# Patient Record
Sex: Female | Born: 1940 | Race: Black or African American | Hispanic: No | Marital: Single | Smoking: Never smoker
Health system: Southern US, Community
[De-identification: ages and names within clinical notes are randomized; demographics above are authoritative.]

## PROBLEM LIST (undated history)

## (undated) DIAGNOSIS — E7800 Pure hypercholesterolemia, unspecified: Secondary | ICD-10-CM

## (undated) DIAGNOSIS — F32A Depression, unspecified: Secondary | ICD-10-CM

## (undated) DIAGNOSIS — I10 Essential (primary) hypertension: Secondary | ICD-10-CM

## (undated) HISTORY — PX: ABDOMINAL HYSTERECTOMY: SHX81

---

## 2020-12-28 DIAGNOSIS — E782 Mixed hyperlipidemia: Secondary | ICD-10-CM | POA: Diagnosis present

## 2020-12-28 DIAGNOSIS — I10 Essential (primary) hypertension: Secondary | ICD-10-CM | POA: Diagnosis present

## 2020-12-28 DIAGNOSIS — K219 Gastro-esophageal reflux disease without esophagitis: Secondary | ICD-10-CM | POA: Diagnosis present

## 2021-01-25 DIAGNOSIS — G3184 Mild cognitive impairment, so stated: Secondary | ICD-10-CM | POA: Diagnosis present

## 2021-04-02 ENCOUNTER — Emergency Department (HOSPITAL_BASED_OUTPATIENT_CLINIC_OR_DEPARTMENT_OTHER): Payer: Medicare HMO

## 2021-04-02 ENCOUNTER — Inpatient Hospital Stay (HOSPITAL_BASED_OUTPATIENT_CLINIC_OR_DEPARTMENT_OTHER)
Admission: EM | Admit: 2021-04-02 | Discharge: 2021-04-04 | DRG: 640 | Disposition: A | Discharge status: Home / Self Care | Payer: Medicare HMO | Attending: Internal Medicine | Admitting: Internal Medicine

## 2021-04-02 ENCOUNTER — Other Ambulatory Visit: Payer: Self-pay

## 2021-04-02 ENCOUNTER — Encounter (HOSPITAL_BASED_OUTPATIENT_CLINIC_OR_DEPARTMENT_OTHER): Payer: Self-pay | Admitting: *Deleted

## 2021-04-02 DIAGNOSIS — E876 Hypokalemia: Secondary | ICD-10-CM | POA: Diagnosis present

## 2021-04-02 DIAGNOSIS — Z7982 Long term (current) use of aspirin: Secondary | ICD-10-CM | POA: Diagnosis not present

## 2021-04-02 DIAGNOSIS — F0390 Unspecified dementia without behavioral disturbance: Secondary | ICD-10-CM | POA: Diagnosis present

## 2021-04-02 DIAGNOSIS — Z20822 Contact with and (suspected) exposure to covid-19: Secondary | ICD-10-CM | POA: Diagnosis present

## 2021-04-02 DIAGNOSIS — R634 Abnormal weight loss: Secondary | ICD-10-CM

## 2021-04-02 DIAGNOSIS — R627 Adult failure to thrive: Secondary | ICD-10-CM | POA: Diagnosis present

## 2021-04-02 DIAGNOSIS — F32A Depression, unspecified: Secondary | ICD-10-CM | POA: Diagnosis present

## 2021-04-02 DIAGNOSIS — K5900 Constipation, unspecified: Secondary | ICD-10-CM | POA: Diagnosis present

## 2021-04-02 DIAGNOSIS — R001 Bradycardia, unspecified: Secondary | ICD-10-CM | POA: Diagnosis present

## 2021-04-02 DIAGNOSIS — K219 Gastro-esophageal reflux disease without esophagitis: Secondary | ICD-10-CM | POA: Diagnosis present

## 2021-04-02 DIAGNOSIS — N179 Acute kidney failure, unspecified: Secondary | ICD-10-CM

## 2021-04-02 DIAGNOSIS — G3184 Mild cognitive impairment, so stated: Secondary | ICD-10-CM | POA: Diagnosis not present

## 2021-04-02 DIAGNOSIS — I10 Essential (primary) hypertension: Secondary | ICD-10-CM | POA: Diagnosis present

## 2021-04-02 DIAGNOSIS — E785 Hyperlipidemia, unspecified: Secondary | ICD-10-CM | POA: Diagnosis present

## 2021-04-02 DIAGNOSIS — Z6826 Body mass index (BMI) 26.0-26.9, adult: Secondary | ICD-10-CM | POA: Diagnosis not present

## 2021-04-02 DIAGNOSIS — Z79899 Other long term (current) drug therapy: Secondary | ICD-10-CM | POA: Diagnosis not present

## 2021-04-02 DIAGNOSIS — E782 Mixed hyperlipidemia: Secondary | ICD-10-CM | POA: Diagnosis present

## 2021-04-02 DIAGNOSIS — N1832 Chronic kidney disease, stage 3b: Secondary | ICD-10-CM

## 2021-04-02 DIAGNOSIS — E871 Hypo-osmolality and hyponatremia: Secondary | ICD-10-CM | POA: Diagnosis present

## 2021-04-02 DIAGNOSIS — R9431 Abnormal electrocardiogram [ECG] [EKG]: Secondary | ICD-10-CM | POA: Diagnosis present

## 2021-04-02 DIAGNOSIS — E43 Unspecified severe protein-calorie malnutrition: Secondary | ICD-10-CM | POA: Diagnosis present

## 2021-04-02 DIAGNOSIS — R630 Anorexia: Secondary | ICD-10-CM | POA: Diagnosis not present

## 2021-04-02 HISTORY — DX: Depression, unspecified: F32.A

## 2021-04-02 HISTORY — DX: Pure hypercholesterolemia, unspecified: E78.00

## 2021-04-02 HISTORY — DX: Essential (primary) hypertension: I10

## 2021-04-02 LAB — CBC WITH DIFFERENTIAL/PLATELET
Abs Immature Granulocytes: 0.02 10*3/uL (ref 0.00–0.07)
Basophils Absolute: 0.1 10*3/uL (ref 0.0–0.1)
Basophils Relative: 1 %
Eosinophils Absolute: 0 10*3/uL (ref 0.0–0.5)
Eosinophils Relative: 0 %
HCT: 36.8 % (ref 36.0–46.0)
Hemoglobin: 12.5 g/dL (ref 12.0–15.0)
Immature Granulocytes: 0 %
Lymphocytes Relative: 31 %
Lymphs Abs: 1.7 10*3/uL (ref 0.7–4.0)
MCH: 27.5 pg (ref 26.0–34.0)
MCHC: 34 g/dL (ref 30.0–36.0)
MCV: 80.9 fL (ref 80.0–100.0)
Monocytes Absolute: 0.4 10*3/uL (ref 0.1–1.0)
Monocytes Relative: 8 %
Neutro Abs: 3.3 10*3/uL (ref 1.7–7.7)
Neutrophils Relative %: 60 %
Platelets: 249 10*3/uL (ref 150–400)
RBC: 4.55 MIL/uL (ref 3.87–5.11)
RDW: 13.5 % (ref 11.5–15.5)
WBC: 5.6 10*3/uL (ref 4.0–10.5)
nRBC: 0 % (ref 0.0–0.2)

## 2021-04-02 LAB — COMPREHENSIVE METABOLIC PANEL
ALT: 19 U/L (ref 0–44)
AST: 40 U/L (ref 15–41)
Albumin: 4.2 g/dL (ref 3.5–5.0)
Alkaline Phosphatase: 17 U/L — ABNORMAL LOW (ref 38–126)
Anion gap: 15 (ref 5–15)
BUN: 32 mg/dL — ABNORMAL HIGH (ref 8–23)
CO2: 30 mmol/L (ref 22–32)
Calcium: 9.8 mg/dL (ref 8.9–10.3)
Chloride: 86 mmol/L — ABNORMAL LOW (ref 98–111)
Creatinine, Ser: 1.93 mg/dL — ABNORMAL HIGH (ref 0.44–1.00)
GFR, Estimated: 26 mL/min — ABNORMAL LOW (ref >60)
Glucose, Bld: 94 mg/dL (ref 70–99)
Potassium: <2 mmol/L — CL (ref 3.5–5.1)
Sodium: 131 mmol/L — ABNORMAL LOW (ref 135–145)
Total Bilirubin: 1.1 mg/dL (ref 0.3–1.2)
Total Protein: 8.2 g/dL — ABNORMAL HIGH (ref 6.5–8.1)

## 2021-04-02 LAB — RESP PANEL BY RT-PCR (FLU A&B, COVID) ARPGX2
Influenza A by PCR: NEGATIVE
Influenza B by PCR: NEGATIVE
SARS Coronavirus 2 by RT PCR: NEGATIVE

## 2021-04-02 LAB — BASIC METABOLIC PANEL
Anion gap: 15 (ref 5–15)
Anion gap: 12 (ref 5–15)
BUN: 32 mg/dL — ABNORMAL HIGH (ref 8–23)
BUN: 30 mg/dL — ABNORMAL HIGH (ref 8–23)
CO2: 28 mmol/L (ref 22–32)
CO2: 29 mmol/L (ref 22–32)
Calcium: 9.6 mg/dL (ref 8.9–10.3)
Calcium: 9.4 mg/dL (ref 8.9–10.3)
Chloride: 88 mmol/L — ABNORMAL LOW (ref 98–111)
Chloride: 96 mmol/L — ABNORMAL LOW (ref 98–111)
Creatinine, Ser: 1.86 mg/dL — ABNORMAL HIGH (ref 0.44–1.00)
Creatinine, Ser: 1.57 mg/dL — ABNORMAL HIGH (ref 0.44–1.00)
GFR, Estimated: 27 mL/min — ABNORMAL LOW (ref >60)
GFR, Estimated: 34 mL/min — ABNORMAL LOW (ref >60)
Glucose, Bld: 101 mg/dL — ABNORMAL HIGH (ref 70–99)
Glucose, Bld: 92 mg/dL (ref 70–99)
Potassium: 2 mmol/L — CL (ref 3.5–5.1)
Potassium: 2.9 mmol/L — ABNORMAL LOW (ref 3.5–5.1)
Sodium: 131 mmol/L — ABNORMAL LOW (ref 135–145)
Sodium: 137 mmol/L (ref 135–145)

## 2021-04-02 LAB — PHOSPHORUS: Phosphorus: 2.7 mg/dL (ref 2.5–4.6)

## 2021-04-02 LAB — MAGNESIUM
Magnesium: 2.1 mg/dL (ref 1.7–2.4)
Magnesium: 2 mg/dL (ref 1.7–2.4)

## 2021-04-02 LAB — LIPASE, BLOOD: Lipase: 64 U/L — ABNORMAL HIGH (ref 11–51)

## 2021-04-02 MED ORDER — ROSUVASTATIN CALCIUM 20 MG PO TABS: 40.0000 mg | ORAL_TABLET | Freq: Every day | ORAL | Status: DC

## 2021-04-02 MED ORDER — ESCITALOPRAM OXALATE 20 MG PO TABS: 20.0000 mg | ORAL_TABLET | Freq: Every day | ORAL | Status: DC

## 2021-04-02 MED ORDER — POTASSIUM CHLORIDE 10 MEQ/100ML IV SOLN
10.0000 meq | INTRAVENOUS | Status: AC
  Administered 2021-04-02 (×3): 10 meq via INTRAVENOUS
  Filled 2021-04-02 (×3): qty 100

## 2021-04-02 MED ORDER — ASPIRIN EC 81 MG PO TBEC
81.0000 mg | DELAYED_RELEASE_TABLET | Freq: Every day | ORAL | Status: DC
  Administered 2021-04-02 – 2021-04-04 (×3): 81 mg via ORAL
  Filled 2021-04-02 (×3): qty 1

## 2021-04-02 MED ORDER — HYDRALAZINE HCL 20 MG/ML IJ SOLN: 10.0000 mg | Freq: Four times a day (QID) | INTRAMUSCULAR | Status: DC | PRN

## 2021-04-02 MED ORDER — SODIUM CHLORIDE 0.9 % IV SOLN: INTRAVENOUS | Status: DC | PRN

## 2021-04-02 MED ORDER — POTASSIUM CHLORIDE CRYS ER 20 MEQ PO TBCR
40.0000 meq | EXTENDED_RELEASE_TABLET | Freq: Once | ORAL | Status: AC
  Administered 2021-04-02: 40 meq via ORAL
  Filled 2021-04-02: qty 2

## 2021-04-02 MED ORDER — LACTATED RINGERS IV BOLUS
1000.0000 mL | Freq: Once | INTRAVENOUS | Status: AC
  Administered 2021-04-02: 1000 mL via INTRAVENOUS

## 2021-04-02 MED ORDER — HEPARIN SODIUM (PORCINE) 5000 UNIT/ML IJ SOLN
5000.0000 [IU] | Freq: Three times a day (TID) | INTRAMUSCULAR | Status: DC
  Administered 2021-04-02 – 2021-04-03 (×3): 5000 [IU] via SUBCUTANEOUS
  Filled 2021-04-02 (×3): qty 1

## 2021-04-02 MED ORDER — POTASSIUM CHLORIDE 10 MEQ/100ML IV SOLN
10.0000 meq | INTRAVENOUS | Status: DC
  Administered 2021-04-02 (×2): 10 meq via INTRAVENOUS
  Filled 2021-04-02 (×2): qty 100

## 2021-04-02 MED ORDER — ROSUVASTATIN CALCIUM 10 MG PO TABS: 10.0000 mg | ORAL_TABLET | Freq: Every day | ORAL | Status: DC

## 2021-04-02 NOTE — Care Plan (Addendum)
Transfer from [facility] Dr. Clarene Duke requesting  79yow presented w/ anorexia, FTT, malaise. Found to have profound hypokalemia w/ classic EKG changes  PMH includes  HTN  Hyperlipidemia   Exam: Per EDP AFVSS, benigh  Data  K+ 2.0, Na+ 131, Cr 1.86  Lipase 64  A/P Profound hypokalemia FTT  OBS to progressive bed Replete potassium F/u CT abd/pelvis pending  EDP remains attending at [facility] and responsible for care until arrives at [facility]. TRH available for questions.  Brendia Sacks, MD [facility]  No charge note

## 2021-04-02 NOTE — Plan of Care (Signed)
Initiated care plan 

## 2021-04-02 NOTE — H&P (Signed)
History and Physical    Alexandra Griffith WUJ:[id number] DOB: 15-Apr-1941 DOA: 04/02/2021  PCP: Spero GeraldsSundaragiri, Pranathi Rao, MD    [facility]   Chief Complaint:  Hypokalemia   HPI: Alexandra PughHattie Griffith is a 80 y.o. female seen in ed with complaints of decreased appetite and constipation found to be hypokalemic at [facility].  Patient has been constipated for nearly 2 weeks.  No reports of abdominal pain urinary symptoms vomiting diarrhea.  Also no muscle aches chest pain shortness of breath.  Patient was seen by Dr. Clarene DukeLittle at [facility] and initial blood work showed severe hypokalemia of a potassium level that was less than 2 and repeat labs showed a potassium level of 2.  Patient's creatinine was 1.86, magnesium of 2.1.    [facility]. Upon my interview patient reports that she has been feeling weak and tired for the past 2 weeks.  Appetite has gone down, reports that she has no desire to eat.  She went to her doctor for constipation.  She was given lactulose for constipation but she still not eating.  Patient denies any other complaints or feeling bad.  Pt has past medical history of hypertension, dyslipidemia, vitamin D deficiency, depression.  ED Course:  Vitals:   04/02/21 1441 04/02/21 1600 04/02/21 1700 04/02/21 1853  BP: 126/81 138/65 127/69 137/65  Pulse: (!) 51 (!) 50 (!) 50 (!) 49  Resp: 17 13 19 14   Temp:    98.4 F (36.9 C)  TempSrc:    Oral  SpO2: 100% 96% 100% 100%  Weight:      Height:      Patient was given potassium chloride IV 30 mEq, potassium chloride 40 mEq p.o. x1.  Her labs showed normal magnesium level.  EKG did show bradycardia with a heart rate of 53, normal PR interval, T wave inversion versus U waves in lead to 3 aVF and V2 V3 V4 V5 and V6, with a significant Q-wave in V1, and a prolonged QT interval of 570. Repeat stat BMP shows a potassium level of 2.9, mag and Phos level pending.  Review  of Systems:  Review of Systems  Constitutional: Positive for malaise/fatigue.  HENT: Negative.   Eyes: Negative.   Respiratory: Negative.   Cardiovascular: Negative.   Gastrointestinal: Negative.   Genitourinary: Negative.   Musculoskeletal: Negative.   Neurological: Negative.       Past Medical History:  Diagnosis Date  . Depression   . High cholesterol   . Hypertension     Past Surgical History:  Procedure Laterality Date  . ABDOMINAL HYSTERECTOMY       reports that she has never smoked. She has never used smokeless tobacco. She reports that she does not drink alcohol and does not use drugs.  No Known Allergies  Family History  Problem Relation Age of Onset  . High blood pressure Mother        8 total children 3 brothers deceased HTN, [place] and complications of agent orange, 1 sister deceased due to complications of dementia 2 sisters living    Prior to Admission medications   Medication Sig Start Date End Date Taking? Authorizing Provider  ergocalciferol (VITAMIN D2) 1.25 MG (50000 UT) capsule Take by mouth. 02/22/21 02/22/22 Yes [provider]  escitalopram (LEXAPRO) 20 MG tablet Take 1 tablet by mouth daily. 03/28/21 06/26/21 Yes [provider]  lactulose (CHRONULAC) 10 GM/15ML solution Take by  mouth. 03/28/21  Yes [provider]  rosuvastatin (CRESTOR) 20 MG tablet Take 1 tablet by mouth daily. 01/25/21  Yes [provider]  aspirin 81 MG EC tablet Take by mouth.    [provider]    Physical Exam: Vitals:   04/02/21 1441 04/02/21 1600 04/02/21 1700 04/02/21 1853  BP: 126/81 138/65 127/69 137/65  Pulse: (!) 51 (!) 50 (!) 50 (!) 49  Resp: 17 13 19 14   Temp:    98.4 F (36.9 C)  TempSrc:    Oral  SpO2: 100% 96% 100% 100%  Weight:      Height:       Physical Exam Constitutional:      General: She is not in acute distress.    Appearance: Normal appearance. She is not ill-appearing, toxic-appearing or  diaphoretic.  HENT:     Head: Normocephalic and atraumatic.     Right Ear: External ear normal.     Left Ear: External ear normal.     Nose: Nose normal.     Mouth/Throat:     Mouth: Mucous membranes are dry.  Eyes:     Extraocular Movements: Extraocular movements intact.     Pupils: Pupils are equal, round, and reactive to light.  Neck:     Vascular: No carotid bruit.  Cardiovascular:     Rate and Rhythm: Normal rate and regular rhythm.     Pulses: Normal pulses.     Heart sounds: Normal heart sounds.  Pulmonary:     Effort: Pulmonary effort is normal.     Breath sounds: Normal breath sounds.  Abdominal:     General: Bowel sounds are normal. There is no distension.     Palpations: Abdomen is soft. There is no mass.     Tenderness: There is no abdominal tenderness. There is no guarding.     Hernia: No hernia is present.  Musculoskeletal:     Right lower leg: No edema.     Left lower leg: No edema.  Skin:    General: Skin is warm.  Neurological:     General: No focal deficit present.     Mental Status: She is alert and oriented to person, place, and time.     Cranial Nerves: No cranial nerve deficit.     Motor: Motor function is intact.  Psychiatric:        Attention and Perception: Attention normal.        Mood and Affect: Mood normal.        Speech: Speech normal.        Behavior: Behavior normal. Behavior is cooperative.    Labs on Admission: I have personally reviewed following labs and imaging studies  No results for input(s): CKTOTAL, CKMB, TROPONINI in the last 72 hours. Lab Results  Component Value Date   WBC 5.6 04/02/2021   HGB 12.5 04/02/2021   HCT 36.8 04/02/2021   MCV 80.9 04/02/2021   PLT 249 04/02/2021    Recent Labs  Lab 04/02/21 1248 04/02/21 1353 04/02/21 1943  NA 131*   < > 137  K <2.0*   < > 2.9*  CL 86*   < > 96*  CO2 30   < > 29  BUN 32*   < > 30*  CREATININE 1.93*   < > 1.57*  CALCIUM 9.8   < > 9.4  PROT 8.2*  --   --   BILITOT  1.1  --   --   ALKPHOS 17*  --   --  ALT 19  --   --   AST 40  --   --   GLUCOSE 94   < > 92   < > = values in this interval not displayed.   No results found for: CHOL, HDL, LDLCALC, TRIG No results found for: DDIMER Invalid input(s): POCBNP  Urinalysis No results found for: COLORURINE, APPEARANCEUR, LABSPEC, PHURINE, GLUCOSEU, HGBUR, BILIRUBINUR, KETONESUR, PROTEINUR, UROBILINOGEN, NITRITE, LEUKOCYTESUR      COVID-19 Labs  No results for input(s): DDIMER, FERRITIN, LDH, CRP in the last 72 hours.  Lab Results  Component Value Date   SARSCOV2NAA NEGATIVE 04/02/2021    Radiological Exams on Admission: CT Abdomen Pelvis Wo Contrast  Result Date: 04/02/2021 CLINICAL DATA:  Weight loss poor appetite constipation EXAM: CT ABDOMEN AND PELVIS WITHOUT CONTRAST TECHNIQUE: Multidetector CT imaging of the abdomen and pelvis was performed following the standard protocol without IV contrast. COMPARISON:  Radiograph 04/02/2021 FINDINGS: Lower chest: Lung bases demonstrate no acute consolidation or pleural effusion. Normal cardiac size. Hepatobiliary: No focal liver abnormality is seen. No gallstones, gallbladder wall thickening, or biliary dilatation. Pancreas: Unremarkable. No pancreatic ductal dilatation or surrounding inflammatory changes. Spleen: Normal in size without focal abnormality. Adrenals/Urinary Tract: Adrenal glands are unremarkable. Kidneys are normal, without renal calculi, focal lesion, or hydronephrosis. Bladder is unremarkable. Stomach/Bowel: Stomach is within normal limits. Diverticular disease of the colon without acute wall thickening. Mild stool burden. No evidence of bowel wall thickening, distention, or inflammatory changes. Vascular/Lymphatic: Moderate aortic atherosclerosis. No aneurysm. No suspicious nodes. Reproductive: Status post hysterectomy. No adnexal masses. Other: Negative for free air or free fluid. Musculoskeletal: No acute or significant osseous findings.  IMPRESSION: 1. No CT evidence for acute intra-abdominal or pelvic abnormality. 2. Diverticular disease of the colon without acute inflammatory process. Electronically Signed   By: Jasmine Pang M.D.   On: 04/02/2021 15:20   DG Abd 1 View  Result Date: 04/02/2021 CLINICAL DATA:  no BM x 2 weeks EXAM: ABDOMEN - 1 VIEW COMPARISON:  None. FINDINGS: The bowel gas pattern is normal. Mild colonic stool burden. No acute osseous abnormality. IMPRESSION: No evidence of bowel obstruction. Electronically Signed   By: Caprice Renshaw   On: 04/02/2021 12:29    EKG: Independently reviewed.  Sinus bradycardia at 53 with TWI vs U waves in leads ii,iii,avf and v2-v6, and prolonged qtc at 570.     Assessment/Plan Principal Problem:   Hypokalemia Active Problems:   Essential hypertension   Gastroesophageal reflux disease   Mixed hyperlipidemia   Mild cognitive impairment   Prolonged QT interval   Sinus bradycardia Hypokalemia: Attribute to thiazide diuretic therapy for hypertension.   Due to severe hypokalemia would cautiously restart if needed for isolated systolic hypertension.   Replacement with potassium chloride 30 M EQ's, current potassium level of 2.9. Severe hypokalemia with EKG changes with prolonged QT, defibrillator pads at bedside, High risk for torsades and or arrest.  We will obtain EKGs every 12x4 occurrences to monitor QTC.'s.  Hypertension: Home regimen of ARB and diuretics held secondary to abnormal kidney function.   We will start patient on hydralazine with as needed parameters.  GERD: IV PPI therapy.  Hyperlipidemia: Continue statin therapy.  Mild cognitive impairment/dementia: Patient states she was started on Lexapro because of her dementia I have cut the dose to 5 mg due to prolonged QT.  Prolonged QT/sinus bradycardia: Patient has EKG changes with prolonged QT, sinus bradycardia, U waves secondary to severe hypokalemia.  We will continue to monitor in  telemetry  unit.    DVT prophylaxis:  heparin   Code Status:  Full Code  Family Communication:  Johnney Ou (Sister)  [phone] (Mobile)   Disposition Plan:  home   Consults called:  None   Admission status: Inpatient.     Gertha Calkin MD [facility] [phone] How to contact the [facility] Attending or Consulting provider 7A - 7P or covering provider during after hours 7P -7A, for this patient.    1. Check the care team in [facility] and look for a) attending/consulting TRH provider listed and b) the [facility] team listed 2. Log into [email or web address] and use [place]'s universal password to access. If you do not have the password, please contact the hospital operator. 3. Locate the [facility] provider you are looking for under [facility] and page to a number that you can be directly reached. 4. If you still have difficulty reaching the provider, please page the [facility] (Director on Call) for the Hospitalists listed on amion for assistance. [email or web address] Password [facility] 04/02/2021, 8:48 PM

## 2021-04-02 NOTE — ED Triage Notes (Signed)
Constipation for over a month. Her MD gave her medication for constipation but she is not taking it due to lack of appetite.

## 2021-04-02 NOTE — ED Provider Notes (Signed)
[facility] EMERGENCY DEPARTMENT Provider Note   CSN: [id number] Arrival date & time: 04/02/21  1055     History Chief Complaint  Patient presents with  . Constipation    Alexandra Griffith is a 80 y.o. female.  80 year old female with past medical history including hypertension, hyperlipidemia, depression who presents with decreased appetite and constipation.  Patient reports at least 1 month of decreased appetite associated with generalized weakness/fatigue, weight loss, and constipation.  She was recently evaluated by PCP for the symptoms and was started on some medicines.  She was given lactulose for constipation but she has not taken it because she states that she has nothing on her stomach because she has not been eating.  Last bowel movement was 2 weeks ago.  She denies any pain whatsoever including no abdominal pain, chest pain.  No shortness of breath, urinary symptoms, vomiting, fevers.  She feels like her blood is thin.  The history is provided by the patient.  Constipation      Past Medical History:  Diagnosis Date  . Depression   . High cholesterol   . Hypertension     There are no problems to display for this patient.   History reviewed. No pertinent surgical history.   OB History   No obstetric history on file.     No family history on file.  Social History   Tobacco Use  . Smoking status: Never Smoker  . Smokeless tobacco: Never Used  Substance Use Topics  . Alcohol use: Never  . Drug use: Never    Home Medications Prior to Admission medications   Medication Sig Start Date End Date Taking? Authorizing Provider  ergocalciferol (VITAMIN D2) 1.25 MG (50000 UT) capsule Take by mouth. 02/22/21 02/22/22 Yes [provider]  escitalopram (LEXAPRO) 20 MG tablet Take 1 tablet by mouth daily. 03/28/21 06/26/21 Yes [provider]  lactulose (CHRONULAC) 10 GM/15ML solution Take by mouth. 03/28/21  Yes [provider]   rosuvastatin (CRESTOR) 20 MG tablet Take 1 tablet by mouth daily. 01/25/21  Yes [provider]  aspirin 81 MG EC tablet Take by mouth.    [provider]    Allergies    Patient has no known allergies.  Review of Systems   Review of Systems  Gastrointestinal: Positive for constipation.   All other systems reviewed and are negative except that which was mentioned in HPI  Physical Exam Updated Vital Signs BP 126/81   Pulse (!) 51   Temp 97.8 F (36.6 C) (Oral)   Resp 17   Ht 5\' 3"  (1.6 m)   Wt 67.2 kg   SpO2 100%   BMI 26.25 kg/m   Physical Exam Constitutional:      General: She is not in acute distress.    Appearance: Normal appearance.  HENT:     Head: Normocephalic and atraumatic.  Eyes:     Conjunctiva/sclera: Conjunctivae normal.  Cardiovascular:     Rate and Rhythm: Regular rhythm. Bradycardia present.     Heart sounds: Murmur heard.    Pulmonary:     Effort: Pulmonary effort is normal.     Breath sounds: Normal breath sounds.  Abdominal:     General: Abdomen is flat. Bowel sounds are normal. There is no distension.     Palpations: Abdomen is soft.     Tenderness: There is no abdominal tenderness.  Musculoskeletal:     Right lower leg: No edema.     Left lower leg: No  edema.  Skin:    General: Skin is warm and dry.  Neurological:     Mental Status: She is alert and oriented to person, place, and time.     Comments: fluent  Psychiatric:        Mood and Affect: Mood is depressed.        Behavior: Behavior normal.     ED Results / Procedures / Treatments   Labs (all labs ordered are listed, but only abnormal results are displayed) Labs Reviewed  COMPREHENSIVE METABOLIC PANEL - Abnormal; Notable for the following components:      Result Value   Sodium 131 (*)    Potassium <2.0 (*)    Chloride 86 (*)    BUN 32 (*)    Creatinine, Ser 1.93 (*)    Total Protein 8.2 (*)    Alkaline Phosphatase 17 (*)    GFR, Estimated 26 (*)     All other components within normal limits  LIPASE, BLOOD - Abnormal; Notable for the following components:   Lipase 64 (*)    All other components within normal limits  BASIC METABOLIC PANEL - Abnormal; Notable for the following components:   Sodium 131 (*)    Potassium 2.0 (*)    Chloride 88 (*)    Glucose, Bld 101 (*)    BUN 32 (*)    Creatinine, Ser 1.86 (*)    GFR, Estimated 27 (*)    All other components within normal limits  RESP PANEL BY RT-PCR (FLU A&B, COVID) ARPGX2  CBC WITH DIFFERENTIAL/PLATELET  MAGNESIUM  URINALYSIS, ROUTINE W REFLEX MICROSCOPIC  SODIUM, URINE, RANDOM  CREATININE, URINE, RANDOM    EKG EKG Interpretation  Date/Time:  Monday Apr 02 2021 13:51:27 EDT Ventricular Rate:  53 PR Interval:  206 QRS Duration: 106 QT Interval:  607 QTC Calculation: 570 R Axis:   13 Text Interpretation: Sinus rhythm Nonspecific T abnormalities, diffuse leads Prolonged QT interval prolonged QTc, U waves in ST segments concerning for hypokalemia sinus bradycardia Confirmed by Frederick Peers [id number]) on 04/02/2021 1:56:11 PM   Radiology DG Abd 1 View  Result Date: 04/02/2021 CLINICAL DATA:  no BM x 2 weeks EXAM: ABDOMEN - 1 VIEW COMPARISON:  None. FINDINGS: The bowel gas pattern is normal. Mild colonic stool burden. No acute osseous abnormality. IMPRESSION: No evidence of bowel obstruction. Electronically Signed   By: Caprice Renshaw   On: 04/02/2021 12:29    Procedures Procedures  CRITICAL CARE Performed by: Ambrose Finland Verbena Boeding   Total critical care time: 30 minutes  Critical care time was exclusive of separately billable procedures and treating other patients.  Critical care was necessary to treat or prevent imminent or life-threatening deterioration.  Critical care was time spent personally by me on the following activities: development of treatment plan with patient and/or surrogate as well as nursing, discussions with consultants, evaluation of patient's response to  treatment, examination of patient, obtaining history from patient or surrogate, ordering and performing treatments and interventions, ordering and review of laboratory studies, ordering and review of radiographic studies, pulse oximetry and re-evaluation of patient's condition.  Medications Ordered in ED Medications  potassium chloride 10 mEq in 100 mL IVPB (10 mEq Intravenous New Bag/Given 04/02/21 1429)  0.9 %  sodium chloride infusion (has no administration in time range)  lactated ringers bolus 1,000 mL (0 mLs Intravenous Stopped 04/02/21 1505)  potassium chloride SA (KLOR-CON) CR tablet 40 mEq (40 mEq Oral Given 04/02/21 1430)    ED Course  I have reviewed the triage vital signs and the nursing notes.  Pertinent labs & imaging results that were available during my care of the patient were reviewed by me and considered in my medical decision making (see chart for details).    MDM Rules/Calculators/A&P                          Nontoxic on exam, afebrile.  No focal abdominal tenderness no complaints of pain.  I had a very circular conversation with the patient trying to figure out what symptoms led her to the ED as it seems like she has been following with PCP for these chronic complaints.  It sounds like she feels like the medications are not helping with her appetite.  Labs notable for K of 2.0, Na 131, Cl 88, BUN 32, Cr 1.86, lipase 64, normal CBC, normal Mg.  COVID and influenza negative.  KUB normal.  Added CT of abdomen and pelvis to evaluate for mass or evidence of malignancy.  CT is negative for acute process to explain the patient's symptoms.  Ordered IV and oral potassium repletion as well as IV fluids.  EKG shows sinus bradycardia with some U waves and prolonged QT concerning for severe hypokalemia.  Given her profoundly low potassium and dehydration, will admit for further management and work-up.  Discussed with [facility] hospitalist, Dr. Irene Limbo.  Patient will be admitted for further  care. Final Clinical Impression(s) / ED Diagnoses Final diagnoses:  Hypokalemia  AKI (acute kidney injury) (HCC)  Anorexia    Rx / DC Orders ED Discharge Orders    None       Avondre Richens, Ambrose Finland, MD 04/02/21 1541

## 2021-04-03 DIAGNOSIS — N1832 Chronic kidney disease, stage 3b: Secondary | ICD-10-CM

## 2021-04-03 DIAGNOSIS — R634 Abnormal weight loss: Secondary | ICD-10-CM

## 2021-04-03 DIAGNOSIS — N179 Acute kidney failure, unspecified: Secondary | ICD-10-CM

## 2021-04-03 DIAGNOSIS — R627 Adult failure to thrive: Secondary | ICD-10-CM

## 2021-04-03 DIAGNOSIS — E43 Unspecified severe protein-calorie malnutrition: Secondary | ICD-10-CM | POA: Insufficient documentation

## 2021-04-03 LAB — URINALYSIS, ROUTINE W REFLEX MICROSCOPIC
Bilirubin Urine: NEGATIVE
Glucose, UA: NEGATIVE mg/dL
Ketones, ur: 5 mg/dL — AB
Leukocytes,Ua: NEGATIVE
Nitrite: NEGATIVE
Protein, ur: 100 mg/dL — AB
Specific Gravity, Urine: 1.01 (ref 1.005–1.030)
pH: 8 (ref 5.0–8.0)

## 2021-04-03 LAB — BASIC METABOLIC PANEL
Anion gap: 13 (ref 5–15)
BUN: 25 mg/dL — ABNORMAL HIGH (ref 8–23)
CO2: 24 mmol/L (ref 22–32)
Calcium: 9 mg/dL (ref 8.9–10.3)
Chloride: 100 mmol/L (ref 98–111)
Creatinine, Ser: 1.41 mg/dL — ABNORMAL HIGH (ref 0.44–1.00)
GFR, Estimated: 38 mL/min — ABNORMAL LOW (ref >60)
Glucose, Bld: 109 mg/dL — ABNORMAL HIGH (ref 70–99)
Potassium: 2.9 mmol/L — ABNORMAL LOW (ref 3.5–5.1)
Sodium: 137 mmol/L (ref 135–145)

## 2021-04-03 LAB — SODIUM, URINE, RANDOM: Sodium, Ur: 92 mmol/L

## 2021-04-03 LAB — POTASSIUM: Potassium: 2.6 mmol/L — CL (ref 3.5–5.1)

## 2021-04-03 LAB — PHOSPHORUS: Phosphorus: 5 mg/dL — ABNORMAL HIGH (ref 2.5–4.6)

## 2021-04-03 LAB — MAGNESIUM: Magnesium: 1.9 mg/dL (ref 1.7–2.4)

## 2021-04-03 LAB — CREATININE, URINE, RANDOM: Creatinine, Urine: 64.25 mg/dL

## 2021-04-03 LAB — CK: Total CK: 309 U/L — ABNORMAL HIGH (ref 38–234)

## 2021-04-03 MED ORDER — BISACODYL 10 MG RE SUPP: 10.0000 mg | Freq: Every day | RECTAL | Status: DC | PRN

## 2021-04-03 MED ORDER — SENNA 8.6 MG PO TABS
1.0000 | ORAL_TABLET | Freq: Every day | ORAL | Status: DC
  Administered 2021-04-03: 8.6 mg via ORAL
  Filled 2021-04-03: qty 1

## 2021-04-03 MED ORDER — BOOST / RESOURCE BREEZE PO LIQD CUSTOM
1.0000 | Freq: Three times a day (TID) | ORAL | Status: DC
  Administered 2021-04-03 – 2021-04-04 (×3): 1 via ORAL

## 2021-04-03 MED ORDER — BOOST PLUS PO LIQD
237.0000 mL | Freq: Three times a day (TID) | ORAL | Status: DC
  Administered 2021-04-03: 237 mL via ORAL
  Filled 2021-04-03 (×4): qty 237

## 2021-04-03 MED ORDER — ADULT MULTIVITAMIN W/MINERALS CH
1.0000 | ORAL_TABLET | Freq: Every day | ORAL | Status: DC
  Administered 2021-04-03 – 2021-04-04 (×2): 1 via ORAL
  Filled 2021-04-03 (×2): qty 1

## 2021-04-03 MED ORDER — POTASSIUM PHOSPHATES 15 MMOLE/5ML IV SOLN
30.0000 mmol | Freq: Once | INTRAVENOUS | Status: AC
  Administered 2021-04-03: 30 mmol via INTRAVENOUS
  Filled 2021-04-03: qty 10

## 2021-04-03 MED ORDER — MAGNESIUM SULFATE 2 GM/50ML IV SOLN
2.0000 g | Freq: Once | INTRAVENOUS | Status: AC
  Administered 2021-04-03: 2 g via INTRAVENOUS
  Filled 2021-04-03: qty 50

## 2021-04-03 MED ORDER — SODIUM CHLORIDE 0.9 % IV SOLN: INTRAVENOUS | Status: DC

## 2021-04-03 MED ORDER — POTASSIUM CHLORIDE CRYS ER 20 MEQ PO TBCR
40.0000 meq | EXTENDED_RELEASE_TABLET | ORAL | Status: AC
  Administered 2021-04-03 (×3): 40 meq via ORAL
  Filled 2021-04-03 (×3): qty 2

## 2021-04-03 MED ORDER — POLYETHYLENE GLYCOL 3350 17 G PO PACK
17.0000 g | PACK | Freq: Two times a day (BID) | ORAL | Status: DC
  Administered 2021-04-03 (×2): 17 g via ORAL
  Filled 2021-04-03 (×2): qty 1

## 2021-04-03 MED ORDER — ENOXAPARIN SODIUM 40 MG/0.4ML IJ SOSY
40.0000 mg | PREFILLED_SYRINGE | INTRAMUSCULAR | Status: DC
  Administered 2021-04-03: 40 mg via SUBCUTANEOUS
  Filled 2021-04-03: qty 0.4

## 2021-04-03 NOTE — Progress Notes (Signed)
PROGRESS NOTE  Alexandra Griffith EXB:[id number] DOB: [birth year] DOA: 04/02/2021 PCP: Spero Geralds, MD  Brief History   80 year old woman presented with anorexia, 20 pound weight loss over a month, fatigue, lightheadedness.  Found to have profound hypokalemia, acute kidney injury, prolonged QT  A & P  Profound hypokalemia with classic EKG changes and severely prolonged QT --Multifactorial: Hydrochlorothiazide, anorexia, poor solute intake --Potassium improved today but still hypokalemic, will continue aggressive repletion --Magnesium below 2.  Replete. --Labs in a.m. including phosphorus.  Prolonged QTC --Resolved.  Secondary to profound hypokalemia.  AKI --baseline unknown but was 1.93 on admission, trending down now, 1.41 today.  Hyponatremia --Resolved, secondary to poor solute intake and hydrochlorothiazide.  Failure to thrive, anorexia, reported 20 pound weight loss --Etiology unclear.  Abdomen and pelvis CT unremarkable.  Query depression.  Close outpatient follow-up.  Essential hypertension --Holding ARB and diuretics secondary to AKI.  Depression --Continue Lexapro  Nutritional Assessment: Body mass index is 26.25 kg/m.Marland Kitchen Seen by dietician.  I agree with the assessment and plan as outlined below: Nutrition Status: Nutrition Problem: Severe Malnutrition Etiology: acute illness Signs/Symptoms: energy intake < or equal to 50% for > or equal to 5 days,percent weight loss,mild fat depletion,mild muscle depletion Percent weight loss: 13 % Interventions: Boost Breeze,MVI,Boost Plus  Disposition Plan:  Discussion: Continue aggressive repletion.  Home potassium and electrolytes stable.  Close outpatient follow-up for weight loss.  Status is: Inpatient  Remains inpatient appropriate because:IV treatments appropriate due to intensity of illness or inability to take PO and Inpatient level of care appropriate due to severity of illness   Dispo: The patient is from:  Home              Anticipated d/c is to: Home              Patient currently is not medically stable to d/c.   Difficult to place patient No  DVT prophylaxis: enoxaparin (LOVENOX) injection 40 mg Start: 04/03/21 1800 SCDs Start: 04/02/21 1934    Code Status: Full Code Level of care: Progressive Family Communication: sister at bedside  Brendia Sacks, MD  [facility] Direct contact: see [email or web address] (further directions at bottom of note if needed) 7PM-7AM contact night coverage as at bottom of note 04/03/2021, 3:47 PM  LOS: 1 day   Significant Hospital Events   . 5/2 admit for profound hypokalemia w/ classic EKG changes   Consults:  . None    Procedures:  . None   Significant Diagnostic Tests:  . CT abd/pelvis unremarkable   Micro Data:  . COVID negative   Antimicrobials:  . None  Interval History/Subjective  CC: f/u low potassium  Feels a lot better today.  Main complaint now is constipation, requesting bowel regimen.  Reports 20 pound weight loss in the last month, anorexia, no appetite.  Denies fever, sweats.  Objective   Vitals:  Vitals:   04/03/21 1511 04/03/21 1513  BP:  102/68  Pulse: (!) 55 (!) 52  Resp:  18  Temp:  98.4 F (36.9 C)  SpO2: 100% 99%    Exam:  Constitutional:   . Appears calm and comfortable ENMT:  . grossly normal hearing  Respiratory:  . CTA bilaterally, no w/r/r.  . Respiratory effort normal.e Cardiovascular:  . RRR, no m/r/g . No LE extremity edema   Abdomen:  . Soft, ntnd Psychiatric:  . Mental status o Mood, affect appropriate  I have personally reviewed the following:   Today's Data  . K+ 2.9 .  Creatinine down to 1.31 . EKG noted  Scheduled Meds: . aspirin EC  81 mg Oral Daily  . enoxaparin (LOVENOX) injection  40 mg Subcutaneous Q24H  . feeding supplement  1 Container Oral TID BM  . lactose free nutrition  237 mL Oral TID WC  . multivitamin with minerals  1 tablet Oral Daily  . polyethylene glycol   17 g Oral BID  . potassium chloride  40 mEq Oral Q4H  . senna  1 tablet Oral QHS   Continuous Infusions: . magnesium sulfate bolus IVPB      Principal Problem:   Hypokalemia Active Problems:   Essential hypertension   Gastroesophageal reflux disease   Mixed hyperlipidemia   Mild cognitive impairment   Prolonged QT interval   Sinus bradycardia   Protein-calorie malnutrition, severe   AKI (acute kidney injury) (HCC)   FTT (failure to thrive) in adult   Weight loss, unintentional   LOS: 1 day   How to contact the [facility] Attending or Consulting provider 7A - 7P or covering provider during after hours 7P -7A, for this patient?  1. Check the care team in [facility] and look for a) attending/consulting TRH provider listed and b) the [facility] team listed 2. Log into [email or web address] and use [place]'s universal password to access. If you do not have the password, please contact the hospital operator. 3. Locate the [facility] provider you are looking for under [facility] and page to a number that you can be directly reached. 4. If you still have difficulty reaching the provider, please page the [facility] (Director on Call) for the Hospitalists listed on amion for assistance.

## 2021-04-03 NOTE — Progress Notes (Signed)
1932 Notified [facility] admitting MD E. Allena Katz of patients arrival.  MD phoned RN.  MD to come see patient, RN asked to obtain family history and surgical history.  Stat BMP ordered, telephone order to add Mag and Phos.  1955 RN placed pt on telemetry, K+ lvl prior to arrival critically low with noted ECG changes per [facility] ED note. 2017 defibrillator pads placed on pt per MD verbal order and crash cart placed outside door.  Repeat K+ resulted @ 2.9 MD ordered IV potassium, pt received IV MD dc 3rd run and order to collect repeat K+ STAT. Repeat came back critical @ 2.6 MD ordered potassium phosphate   Pt alert,oriented and appropriate, some memory impairment per patient. VSS pt in no distress

## 2021-04-03 NOTE — Hospital Course (Signed)
80 year old woman presented with anorexia, 20 pound weight loss over a month, fatigue, lightheadedness.  Found to have profound hypokalemia, acute kidney injury, prolonged QT  A & P  Profound hypokalemia with classic EKG changes and severely prolonged QT --Multifactorial: Hydrochlorothiazide, anorexia, poor solute intake --Potassium improved today but still hypokalemic, will continue aggressive repletion --Magnesium below 2.  Replete. --Labs in a.m. including phosphorus.  Prolonged QTC --Resolved.  Secondary to profound hypokalemia.  AKI --baseline unknown but was 1.93 on admission, trending down now, 1.41 today.  Hyponatremia --Resolved, secondary to poor solute intake and hydrochlorothiazide.  Failure to thrive, anorexia, reported 20 pound weight loss --Etiology unclear.  Abdomen and pelvis CT unremarkable.  Query depression.  Close outpatient follow-up.  Essential hypertension --Holding ARB and diuretics secondary to AKI.  Depression --Continue Lexapro

## 2021-04-03 NOTE — TOC Progression Note (Signed)
Transition of Care [facility]) - Progression Note    Patient Details  Name: Alexandra Griffith MRN: [id number] Date of Birth: 01/04/1941  Transition of Care [facility]) CM/SW Contact  Geni Bers, RN Phone Number: 04/03/2021, 11:22 AM  Clinical Narrative:    Pt from home with relatives and plan to return. TOC will continue to follow.  Expected Discharge Plan: Home/Self Care Barriers to Discharge: No Barriers Identified  Expected Discharge Plan and Services Expected Discharge Plan: Home/Self Care       Living arrangements for the past 2 months: Single Family Home                                       Social Determinants of Health (SDOH) Interventions    Readmission Risk Interventions No flowsheet data found.

## 2021-04-03 NOTE — Progress Notes (Addendum)
1845 Pt arrived via [facility]stretcher from [facility].  Day RN took pt to bathroom, and settle pt into the bed took arrival VS.

## 2021-04-03 NOTE — Progress Notes (Signed)
Initial Nutrition Assessment  DOCUMENTATION CODES:  Severe malnutrition in context of acute illness/injury  INTERVENTION:  Consider bowel regimen for constipation.  Add Boost Plus po TID, each supplement provides 360 kcal and 14 grams of protein.  Add Boost Breeze po TID, each supplement provides 250 kcal and 9 grams of protein.  Add MVI with minerals daily.  NUTRITION DIAGNOSIS:  Severe Malnutrition related to acute illness as evidenced by energy intake < or equal to 50% for > or equal to 5 days,percent weight loss,mild fat depletion,mild muscle depletion.  GOAL:  Patient will meet greater than or equal to 90% of their needs  MONITOR:  PO intake,Supplement acceptance,Diet advancement,Labs,Weight trends,I & O's  REASON FOR ASSESSMENT:  Malnutrition Screening Tool    ASSESSMENT:  80 yo female with a PMH of HTN, high cholesterol, and depression who presents with hypokalemia, constipation for nearly two weeks, and decreased appetite.  Spoke with pt at bedside as she had finished breakfast. She reports having a banana and some juice, but not the grits. She reports having very little appetite. This has been going on for two weeks. She also reports this is the most she has eaten in 2 weeks. She also reports constipation for this amount of time and nothing has helped her go.  She endorses a 20 lb weight loss in the past 2 weeks because of her decreased intake. She usually weighs 170 lbs and now weighs 148 lbs. This is significant and severe with a weight loss of 22 lbs (13%) in 2 weeks. Pt denies any difficulty with ambulation.  On exam, pt had some mild depletions. Pt has acute severe malnutrition.  Recommend starting bowel regimen to help with constipation - secured chatted MD regarding this. Also recommend Boost Plus TID and Boost Breeze TID (pt likes juices) to promote caloric and protein intake. Also recommend MVI with minerals.  Medications: Klor-Con 40 mEq 6 times per day Labs:  reviewed; K 2.9, Glucose 109, Phos 5.0  NUTRITION - FOCUSED PHYSICAL EXAM: Flowsheet Row Most Recent Value  Orbital Region Mild depletion  Upper Arm Region No depletion  Thoracic and Lumbar Region No depletion  Buccal Region Mild depletion  Temple Region Mild depletion  Clavicle Bone Region Mild depletion  Clavicle and Acromion Bone Region Mild depletion  Scapular Bone Region Unable to assess  Dorsal Hand No depletion  Patellar Region No depletion  Anterior Thigh Region No depletion  Posterior Calf Region No depletion  Edema (RD Assessment) None  Hair Reviewed  Eyes Reviewed  Mouth Reviewed  Skin Reviewed  Nails Reviewed     Diet Order:   Diet Order            DIET SOFT Room service appropriate? Yes; Fluid consistency: Thin  Diet effective now                EDUCATION NEEDS:  Education needs have been addressed  Skin:  Skin Assessment: Reviewed RN Assessment  Last BM:  PTA/unknown  Height:  Ht Readings from Last 1 Encounters:  04/02/21 5\' 3"  (1.6 m)   Weight:  Wt Readings from Last 1 Encounters:  04/02/21 67.2 kg   Ideal Body Weight:  52.3 kg  BMI:  Body mass index is 26.25 kg/m.  Estimated Nutritional Needs:  Kcal:  1650-1850 Protein:  75-90 grams Fluid:  >1.65 L  06/02/21, RD, LDN Registered Dietitian After Hours/Weekend Pager # in [place]

## 2021-04-04 LAB — PHOSPHORUS: Phosphorus: 2.3 mg/dL — ABNORMAL LOW (ref 2.5–4.6)

## 2021-04-04 LAB — BASIC METABOLIC PANEL
Anion gap: 9 (ref 5–15)
BUN: 18 mg/dL (ref 8–23)
CO2: 21 mmol/L — ABNORMAL LOW (ref 22–32)
Calcium: 9.2 mg/dL (ref 8.9–10.3)
Chloride: 106 mmol/L (ref 98–111)
Creatinine, Ser: 1.35 mg/dL — ABNORMAL HIGH (ref 0.44–1.00)
GFR, Estimated: 40 mL/min — ABNORMAL LOW (ref >60)
Glucose, Bld: 88 mg/dL (ref 70–99)
Potassium: 3.4 mmol/L — ABNORMAL LOW (ref 3.5–5.1)
Sodium: 136 mmol/L (ref 135–145)

## 2021-04-04 LAB — MAGNESIUM: Magnesium: 2.2 mg/dL (ref 1.7–2.4)

## 2021-04-04 MED ORDER — BOOST BREEZE PO LIQD: 1.0000 | Freq: Three times a day (TID) | ORAL | 0 refills | Status: AC

## 2021-04-04 MED ORDER — ADULT MULTIVITAMIN W/MINERALS CH: 1.0000 | ORAL_TABLET | Freq: Every day | ORAL | 0 refills | Status: DC

## 2021-04-04 MED ORDER — K PHOS MONO-SOD PHOS DI & MONO 155-852-130 MG PO TABS
500.0000 mg | ORAL_TABLET | Freq: Three times a day (TID) | ORAL | Status: DC
  Administered 2021-04-04 (×2): 500 mg via ORAL
  Filled 2021-04-04 (×3): qty 2

## 2021-04-04 MED ORDER — MIRTAZAPINE 15 MG PO TABS
15.0000 mg | ORAL_TABLET | Freq: Every day | ORAL | 0 refills | Status: DC
Start: 2021-04-04 — End: 2022-02-16

## 2021-04-04 NOTE — Progress Notes (Signed)
Pt thought she needed to pass gas but had a small liquid incontinent BM.  Pt was able to get up to bathroom and finish. Liquid BM, may need to hold back on bowel regiment

## 2021-04-06 NOTE — Discharge Summary (Signed)
[facility] Discharge Summary   Patient: Alexandra PughHattie Gamel ZOX:[id number]  PCP: Spero GeraldsSundaragiri, Pranathi Rao, MD  Date of admission: 04/02/2021   Date of discharge: 04/04/2021     Discharge Diagnoses:  Principal Problem:   Hypokalemia Active Problems:   Essential hypertension   Gastroesophageal reflux disease   Mixed hyperlipidemia   Mild cognitive impairment   Prolonged QT interval   Sinus bradycardia   Protein-calorie malnutrition, severe   AKI (acute kidney injury) (HCC)   FTT (failure to thrive) in adult   Weight loss, unintentional  Admitted From: home Disposition:  Home   Recommendations for Outpatient Follow-up:  1. PCP: follow up in 1 week 2. Follow up LABS/TEST:  none 3. New Meds: boost, remeron replacing lexapro,  4. Changed meds: none 5. Stopped meds: lexapro stopped, crestor, diovan-hct   Follow-up Information    Spero GeraldsSundaragiri, Pranathi Rao, MD. Schedule an appointment as soon as possible for a visit in 1 week(s).   Specialty: Internal Medicine Contact information: [address] [place] [id number] [phone]              Discharge Instructions    Diet - low sodium heart healthy   Complete by: As directed    Increase activity slowly   Complete by: As directed       Diet recommendation: Cardiac diet  Activity: The patient is advised to gradually reintroduce usual activities, as tolerated  Discharge Condition: stable  Code Status: Full code   History of present illness: As per the H and P dictated on admission, "Alexandra PughHattie Griffith is a 80 y.o. female seen in ed with complaints of decreased appetite and constipation found to be [facility] [place].  Patient has been constipated for nearly 2 weeks.  No reports of abdominal pain urinary symptoms vomiting diarrhea.  Also no muscle aches chest pain shortness of breath.  Patient was seen by [facility] Clarene DukeLittle at [facility] [place] and initial blood work showed severe hypokalemia of a  potassium level that was less than 2 and repeat labs showed a potassium level of 2.  Patient's creatinine was 1.86, magnesium of 2.1.    Patient [facility] [place]. Upon my interview patient reports that she has been feeling weak and tired for the past 2 weeks.  Appetite has gone down, reports that she has no desire to eat.  She went to her doctor for constipation.  She was given lactulose for constipation but she still not eating.  Patient denies any other complaints or feeling bad.  Pt has past medical history of hypertension, dyslipidemia, vitamin D deficiency, depression."  Hospital Course:  Summary of her active problems in the hospital is as following. Profound hypokalemia  Hypomagnesemia  Mild hypophosphatemia  Prolonged QT Multifactorial: Hydrochlorothiazide, anorexia, poor solute intake Potassium improved today Magnesium repeated  Prolonged QTC Resolved.  Secondary to profound hypokalemia.  AKI baseline unknown but was 1.93 on admission, trending down no.  Stopped diovan HCT  Hyponatremia Resolved, secondary to poor solute intake and hydrochlorothiazide.  Failure to thrive, anorexia, reported 20 pound weight loss Etiology unclear.   Abdomen and pelvis CT unremarkable.   Query depression. Not suicidal  Changed lexapro to remeron.   Essential hypertension Holding ARB and diuretics secondary to AKI.  Depression Continue Lexapro  Severe malnutrition Continue MVI and boost She tells me that she has no appetite and has early satiety. Body mass index is 26.25 kg/m.  Nutrition Problem:  Severe Malnutrition Etiology: acute illness Nutrition Interventions: Interventions: Boost Breeze,MVI,Boost Plus  Patient was ambulatory without any assistance. On the day of the discharge the patient's vitals were stable, and no other acute medical condition were reported by patient. The patient was felt safe to be discharge at Home with no therapy  needed on discharge.  Consultants: none Procedures: none  DISCHARGE MEDICATION: Allergies as of 04/04/2021   No Known Allergies     Medication List    STOP taking these medications   escitalopram 20 MG tablet Commonly known as: LEXAPRO   lactulose 10 GM/15ML solution Commonly known as: CHRONULAC   rosuvastatin 40 MG tablet Commonly known as: CRESTOR   valsartan-hydrochlorothiazide 160-12.5 MG tablet Commonly known as: DIOVAN-HCT     TAKE these medications   aspirin 81 MG EC tablet Take by mouth.   ergocalciferol 1.25 MG (50000 UT) capsule Commonly known as: VITAMIN D2 Take 50,000 Units by mouth every 3 (three) days.   feeding supplement (BOOST BREEZE) Liqd Take 1 each by mouth 3 (three) times daily.   mirtazapine 15 MG tablet Commonly known as: Remeron Take 1 tablet (15 mg total) by mouth at bedtime.   multivitamin with minerals Tabs tablet Take 1 tablet by mouth daily.       Discharge Exam: Filed Weights   04/02/21 1106  Weight: 67.2 kg   Vitals:   04/04/21 0421 04/04/21 1325  BP:  (!) 100/58  Pulse:  (!) 58  Resp:  18  Temp: 97.8 F (36.6 C) 98.1 F (36.7 C)  SpO2:  97%   General: Appear in mild distress, no Rash; Oral Mucosa Clear, moist. no Abnormal Neck Mass Or lumps, Conjunctiva normal  Cardiovascular: S1 and S2 Present, no Murmur, Respiratory: good respiratory effort, Bilateral Air entry present and CTA, no Crackles, no wheezes Abdomen: Bowel Sound present, Soft and no tenderness Extremities: no Pedal edema Neurology: alert and oriented to time, place, and person affect appropriate. no new focal deficit Gait not checked due to patient safety concerns  The results of significant diagnostics from this hospitalization (including imaging, microbiology, ancillary and laboratory) are listed below for reference.    Significant Diagnostic Studies: CT Abdomen Pelvis Wo Contrast  Result Date: 04/02/2021 CLINICAL DATA:  Weight loss poor appetite  constipation EXAM: CT ABDOMEN AND PELVIS WITHOUT CONTRAST TECHNIQUE: Multidetector CT imaging of the abdomen and pelvis was performed following the standard protocol without IV contrast. COMPARISON:  Radiograph 04/02/2021 FINDINGS: Lower chest: Lung bases demonstrate no acute consolidation or pleural effusion. Normal cardiac size. Hepatobiliary: No focal liver abnormality is seen. No gallstones, gallbladder wall thickening, or biliary dilatation. Pancreas: Unremarkable. No pancreatic ductal dilatation or surrounding inflammatory changes. Spleen: Normal in size without focal abnormality. Adrenals/Urinary Tract: Adrenal glands are unremarkable. Kidneys are normal, without renal calculi, focal lesion, or hydronephrosis. Bladder is unremarkable. Stomach/Bowel: Stomach is within normal limits. Diverticular disease of the colon without acute wall thickening. Mild stool burden. No evidence of bowel wall thickening, distention, or inflammatory changes. Vascular/Lymphatic: Moderate aortic atherosclerosis. No aneurysm. No suspicious nodes. Reproductive: Status post hysterectomy. No adnexal masses. Other: Negative for free air or free fluid. Musculoskeletal: No acute or significant osseous findings. IMPRESSION: 1. No CT evidence for acute intra-abdominal or pelvic abnormality. 2. Diverticular disease of the colon without acute inflammatory process. Electronically Signed   By: Jasmine Pang M.D.   On: 04/02/2021 15:20   DG Abd 1 View  Result Date: 04/02/2021 CLINICAL DATA:  no BM x 2 weeks EXAM: ABDOMEN -  1 VIEW COMPARISON:  None. FINDINGS: The bowel gas pattern is normal. Mild colonic stool burden. No acute osseous abnormality. IMPRESSION: No evidence of bowel obstruction. Electronically Signed   By: Caprice Renshaw   On: 04/02/2021 12:29    Microbiology: Recent Results (from the past 240 hour(s))  Resp Panel by RT-PCR (Flu A&B, Covid) Nasopharyngeal Swab     Status: None   Collection Time: 04/02/21  2:36 PM   Specimen:  Nasopharyngeal Swab; Nasopharyngeal(NP) swabs in vial transport medium  Result Value Ref Range Status   SARS Coronavirus 2 by RT PCR NEGATIVE NEGATIVE Final    Comment: (NOTE) SARS-CoV-2 target nucleic acids are NOT DETECTED.  The SARS-CoV-2 RNA is generally detectable in upper respiratory specimens during the acute phase of infection. The lowest concentration of SARS-CoV-2 viral copies this assay can detect is 138 copies/mL. A negative result does not preclude SARS-Cov-2 infection and should not be used as the sole basis for treatment or other patient management decisions. A negative result may occur with  improper specimen collection/handling, submission of specimen other than nasopharyngeal swab, presence of viral mutation(s) within the areas targeted by this assay, and inadequate number of viral copies(<138 copies/mL). A negative result must be combined with clinical observations, patient history, and epidemiological information. The expected result is Negative.  Fact Sheet for Patients:  BloggerCourse.com  Fact Sheet for Healthcare Providers:  SeriousBroker.it  This test is no t yet approved or cleared by the [place] FDA and  has been authorized for detection and/or diagnosis of SARS-CoV-2 by FDA under an Emergency Use Authorization (EUA). This EUA will remain  in effect (meaning this test can be used) for the duration of the COVID-19 declaration under Section 564(b)(1) of the Act, 21 U.S.C.section 360bbb-3(b)(1), unless the authorization is terminated  or revoked sooner.       Influenza A by PCR NEGATIVE NEGATIVE Final   Influenza B by PCR NEGATIVE NEGATIVE Final    Comment: (NOTE) The Xpert Xpress SARS-CoV-2/FLU/RSV plus assay is intended as an aid in the diagnosis of influenza from Nasopharyngeal swab specimens and should not be used as a sole basis for treatment. Nasal washings and aspirates are unacceptable for  Xpert Xpress SARS-CoV-2/FLU/RSV testing.  Fact Sheet for Patients: BloggerCourse.com  Fact Sheet for Healthcare Providers: SeriousBroker.it  This test is not yet approved or cleared by the [place] FDA and has been authorized for detection and/or diagnosis of SARS-CoV-2 by FDA under an Emergency Use Authorization (EUA). This EUA will remain in effect (meaning this test can be used) for the duration of the COVID-19 declaration under Section 564(b)(1) of the Act, 21 U.S.C. section 360bbb-3(b)(1), unless the authorization is terminated or revoked.  Performed at [facility], [address] [place], [place] [address]      Labs: CBC: Recent Labs  Lab 04/02/21 1248  WBC 5.6  NEUTROABS 3.3  HGB 12.5  HCT 36.8  MCV 80.9  PLT 249   Basic Metabolic Panel: Recent Labs  Lab 04/02/21 1248 04/02/21 1353 04/02/21 1943 04/03/21 0034 04/03/21 0608 04/04/21 0337  NA 131* 131* 137  --  137 136  K <2.0* 2.0* 2.9* 2.6* 2.9* 3.4*  CL 86* 88* 96*  --  100 106  CO2 30 28 29   --  24 21*  GLUCOSE 94 101* 92  --  109* 88  BUN 32* 32* 30*  --  25* 18  CREATININE 1.93* 1.86* 1.57*  --  1.41* 1.35*  CALCIUM 9.8  9.6 9.4  --  9.0 9.2  MG  --  2.1 2.0  --  1.9 2.2  PHOS  --   --  2.7  --  5.0* 2.3*   Liver Function Tests: Recent Labs  Lab 04/02/21 1248  AST 40  ALT 19  ALKPHOS 17*  BILITOT 1.1  PROT 8.2*  ALBUMIN 4.2   CBG: No results for input(s): GLUCAP in the last 168 hours.  Time spent: 35 minutes  Signed:  Lynden Oxford  [facility] 04/04/2021 8:39 AM

## 2021-07-07 ENCOUNTER — Other Ambulatory Visit: Payer: Self-pay

## 2021-07-07 ENCOUNTER — Emergency Department (HOSPITAL_BASED_OUTPATIENT_CLINIC_OR_DEPARTMENT_OTHER)
Admission: EM | Admit: 2021-07-07 | Discharge: 2021-07-08 | Disposition: A | Discharge status: Home / Self Care | Payer: Medicare HMO | Attending: Emergency Medicine | Admitting: Emergency Medicine

## 2021-07-07 ENCOUNTER — Encounter (HOSPITAL_BASED_OUTPATIENT_CLINIC_OR_DEPARTMENT_OTHER): Payer: Self-pay | Admitting: *Deleted

## 2021-07-07 DIAGNOSIS — I10 Essential (primary) hypertension: Secondary | ICD-10-CM

## 2021-07-07 DIAGNOSIS — Z79899 Other long term (current) drug therapy: Secondary | ICD-10-CM | POA: Insufficient documentation

## 2021-07-07 DIAGNOSIS — R030 Elevated blood-pressure reading, without diagnosis of hypertension: Secondary | ICD-10-CM | POA: Diagnosis present

## 2021-07-07 DIAGNOSIS — Z7982 Long term (current) use of aspirin: Secondary | ICD-10-CM | POA: Diagnosis not present

## 2021-07-07 DIAGNOSIS — E876 Hypokalemia: Secondary | ICD-10-CM

## 2021-07-07 DIAGNOSIS — T502X5A Adverse effect of carbonic-anhydrase inhibitors, benzothiadiazides and other diuretics, initial encounter: Secondary | ICD-10-CM

## 2021-07-07 NOTE — ED Provider Notes (Signed)
[facility] EMERGENCY DEPARTMENT Provider Note   CSN: [id number] Arrival date & time: 07/07/21  2132     History Chief Complaint  Patient presents with   Hypertension    Alexandra Griffith is a 80 y.o. female.  The history is provided by the patient.  Hypertension She has history of hypertension, hyperlipidemia and comes in because her blood pressure was high.  She has a history of hypertension and was taking valsartan-hydrochlorothiazide.  This was discontinued during hospitalization 3 months ago where she had an acute kidney injury and low potassium.  She saw a physician 1 week ago for an unrelated problem and blood pressure was noted to be 180 and she was started back on valsartan-hydrochlorothiazide.  Today, patient took her blood pressure several times and systolics have been between 190 and 210.  She denies headache, tinnitus, epistaxis.  She denies chest pain or dyspnea.  She states she feels fine, except her blood pressure is elevated.   Past Medical History:  Diagnosis Date   Depression    High cholesterol    Hypertension     Patient Active Problem List   Diagnosis Date Noted   Protein-calorie malnutrition, severe 04/03/2021   AKI (acute kidney injury) (HCC) 04/03/2021   FTT (failure to thrive) in adult 04/03/2021   Weight loss, unintentional 04/03/2021   Hypokalemia 04/02/2021   Hypokalemia due to loss of potassium 04/02/2021   Prolonged QT interval 04/02/2021   Sinus bradycardia 04/02/2021   Mild cognitive impairment 01/25/2021   Essential hypertension 12/28/2020   Gastroesophageal reflux disease 12/28/2020   Mixed hyperlipidemia 12/28/2020    Past Surgical History:  Procedure Laterality Date   ABDOMINAL HYSTERECTOMY       OB History   No obstetric history on file.     Family History  Problem Relation Age of Onset   High blood pressure Mother        8 total children 3 brothers deceased HTN, [place] and complications of agent orange, 1 sister  deceased due to complications of dementia 2 sisters living    Social History   Tobacco Use   Smoking status: Never   Smokeless tobacco: Never  Substance Use Topics   Alcohol use: Yes   Drug use: Never    Home Medications Prior to Admission medications   Medication Sig Start Date End Date Taking? Authorizing Provider  aspirin 81 MG EC tablet Take by mouth.    [provider]  ergocalciferol (VITAMIN D2) 1.25 MG (50000 UT) capsule Take 50,000 Units by mouth every 3 (three) days. 02/22/21 02/22/22  [provider]  mirtazapine (REMERON) 15 MG tablet Take 1 tablet (15 mg total) by mouth at bedtime. 04/04/21   Rolly Salter, MD  Multiple Vitamin (MULTIVITAMIN WITH MINERALS) TABS tablet Take 1 tablet by mouth daily. 04/05/21   Rolly Salter, MD  Nutritional Supplements (FEEDING SUPPLEMENT, BOOST BREEZE,) LIQD Take 1 each by mouth 3 (three) times daily. 04/04/21   Rolly Salter, MD    Allergies    Patient has no known allergies.  Review of Systems   Review of Systems  All other systems reviewed and are negative.  Physical Exam Updated Vital Signs BP (!) 193/99   Pulse 70   Temp 98.5 F (36.9 C) (Oral)   Resp 16   Ht 5\' 3"  (1.6 m)   Wt 73.5 kg   SpO2 95%   BMI 28.70 kg/m   Physical Exam Vitals and nursing note reviewed.  80 year  old female, resting comfortably and in no acute distress. Vital signs are significant for elevated blood pressure. Oxygen saturation is 95%, which is normal. Head is normocephalic and atraumatic. PERRLA, EOMI. Oropharynx is clear.  Fundi show no hemorrhage, exudate, papilledema. Neck is nontender and supple without adenopathy or JVD. Back is nontender and there is no CVA tenderness. Lungs are clear without rales, wheezes, or rhonchi. Chest is nontender. Heart has regular rate and rhythm with 2/6 systolic ejection murmur best heard at the upper right sternal border. Abdomen is soft, flat, nontender without masses or  hepatosplenomegaly and peristalsis is normoactive. Extremities have no cyanosis or edema, full range of motion is present. Skin is warm and dry without rash. Neurologic: Mental status is normal, cranial nerves are intact, there are no motor or sensory deficits.  ED Results / Procedures / Treatments   Labs (all labs ordered are listed, but only abnormal results are displayed) Labs Reviewed  BASIC METABOLIC PANEL - Abnormal; Notable for the following components:      Result Value   Potassium 3.3 (*)    Glucose, Bld 100 (*)    All other components within normal limits  CBC WITH DIFFERENTIAL/PLATELET  MAGNESIUM    EKG EKG Interpretation  Date/Time:  Saturday July 07 2021 23:43:07 EDT Ventricular Rate:  65 PR Interval:  172 QRS Duration: 95 QT Interval:  504 QTC Calculation: 525 R Axis:   41 Text Interpretation: Sinus rhythm Consider left atrial enlargement Probable anteroseptal infarct, old Prolonged QT interval Nonspecific T wave abnormality When compared with ECG of 04/03/2021, QT has lengthened Confirmed by Dione Booze ([id number]) on 07/07/2021 11:54:12 PM  Procedures Procedures   Medications Ordered in ED Medications  potassium chloride SA (KLOR-CON) CR tablet 40 mEq (40 mEq Oral Given 07/08/21 0112)  amLODipine (NORVASC) tablet 5 mg (5 mg Oral Given 07/08/21 0112)    ED Course  I have reviewed the triage vital signs and the nursing notes.  Pertinent lab results that were available during my care of the patient were reviewed by me and considered in my medical decision making (see chart for details).   MDM Rules/Calculators/A&P                         Elevated blood pressure in patient with known history of hypertension.  No evidence of any endorgan damage.  Old records are reviewed confirming hospitalization 3 months ago for acute kidney injury and severe hypokalemia.  She was also noted to have a prolonged QT interval at that time.  We will check electrolytes and magnesium and  ECG.  Labs show mild hypokalemia, improved renal function compared with most recent value.  She is given a dose of oral potassium.  Blood pressure has continued to stay elevated.  We will add amlodipine to her antihypertensive regimen.  She is discharged with prescriptions for amlodipine and K-Dur.  Advised to check her blood pressure daily and keep a record of it.  Recommended follow-up with PCP in 2 weeks.  Patient advised that it may take that long to see the benefit of the medication change.  Final Clinical Impression(s) / ED Diagnoses Final diagnoses:  Poorly-controlled hypertension  Diuretic-induced hypokalemia    Rx / DC Orders ED Discharge Orders          Ordered    potassium chloride SA (KLOR-CON) 20 MEQ tablet  2 times daily        07/08/21 0130    amLODipine (NORVASC)  5 MG tablet  Daily        07/08/21 0130             Dione Booze, MD 07/08/21 [phone]

## 2021-07-07 NOTE — ED Triage Notes (Signed)
Pt reports elevated blood pressure today. Denies pain, denies other Sx. States it was high one day last week also. Reports high reading of 213/117 this am

## 2021-07-08 LAB — MAGNESIUM: Magnesium: 1.9 mg/dL (ref 1.7–2.4)

## 2021-07-08 LAB — CBC WITH DIFFERENTIAL/PLATELET
Abs Immature Granulocytes: 0.03 10*3/uL (ref 0.00–0.07)
Basophils Absolute: 0 10*3/uL (ref 0.0–0.1)
Basophils Relative: 1 %
Eosinophils Absolute: 0.2 10*3/uL (ref 0.0–0.5)
Eosinophils Relative: 3 %
HCT: 37.6 % (ref 36.0–46.0)
Hemoglobin: 12.8 g/dL (ref 12.0–15.0)
Immature Granulocytes: 1 %
Lymphocytes Relative: 40 %
Lymphs Abs: 2.3 10*3/uL (ref 0.7–4.0)
MCH: 28.1 pg (ref 26.0–34.0)
MCHC: 34 g/dL (ref 30.0–36.0)
MCV: 82.6 fL (ref 80.0–100.0)
Monocytes Absolute: 0.3 10*3/uL (ref 0.1–1.0)
Monocytes Relative: 6 %
Neutro Abs: 2.9 10*3/uL (ref 1.7–7.7)
Neutrophils Relative %: 49 %
Platelets: 243 10*3/uL (ref 150–400)
RBC: 4.55 MIL/uL (ref 3.87–5.11)
RDW: 12.4 % (ref 11.5–15.5)
Smear Review: NORMAL
WBC: 5.7 10*3/uL (ref 4.0–10.5)
nRBC: 0 % (ref 0.0–0.2)

## 2021-07-08 LAB — BASIC METABOLIC PANEL
Anion gap: 11 (ref 5–15)
BUN: 14 mg/dL (ref 8–23)
CO2: 25 mmol/L (ref 22–32)
Calcium: 9.6 mg/dL (ref 8.9–10.3)
Chloride: 103 mmol/L (ref 98–111)
Creatinine, Ser: 0.77 mg/dL (ref 0.44–1.00)
GFR, Estimated: >60 mL/min (ref >60)
Glucose, Bld: 100 mg/dL — ABNORMAL HIGH (ref 70–99)
Potassium: 3.3 mmol/L — ABNORMAL LOW (ref 3.5–5.1)
Sodium: 139 mmol/L (ref 135–145)

## 2021-07-08 MED ORDER — POTASSIUM CHLORIDE CRYS ER 20 MEQ PO TBCR
40.0000 meq | EXTENDED_RELEASE_TABLET | Freq: Once | ORAL | Status: AC
  Administered 2021-07-08: 40 meq via ORAL
  Filled 2021-07-08: qty 2

## 2021-07-08 MED ORDER — AMLODIPINE BESYLATE 5 MG PO TABS: 5.0000 mg | ORAL_TABLET | Freq: Every day | ORAL | 0 refills | Status: AC

## 2021-07-08 MED ORDER — AMLODIPINE BESYLATE 5 MG PO TABS
5.0000 mg | ORAL_TABLET | Freq: Once | ORAL | Status: AC
  Administered 2021-07-08: 5 mg via ORAL
  Filled 2021-07-08: qty 1

## 2021-07-08 MED ORDER — POTASSIUM CHLORIDE CRYS ER 20 MEQ PO TBCR: 20.0000 meq | EXTENDED_RELEASE_TABLET | Freq: Two times a day (BID) | ORAL | 1 refills | Status: DC

## 2021-07-08 NOTE — Discharge Instructions (Addendum)
Please continue taking your Diovan and Crestor.  You have been given a new prescription for amlodipine.  It may take several weeks on the new medication for your blood pressure to come down.  You have also been given a prescription for potassium because the Diovan can make you lose potassium.  Please check your blood pressure once or twice a day.  Keep a record of the blood pressure readings, and take that record with you when you see your primary care provider.

## 2022-02-14 ENCOUNTER — Other Ambulatory Visit: Payer: Self-pay

## 2022-02-14 ENCOUNTER — Emergency Department (HOSPITAL_BASED_OUTPATIENT_CLINIC_OR_DEPARTMENT_OTHER): Payer: Medicare HMO

## 2022-02-14 ENCOUNTER — Encounter (HOSPITAL_BASED_OUTPATIENT_CLINIC_OR_DEPARTMENT_OTHER): Payer: Self-pay

## 2022-02-14 ENCOUNTER — Inpatient Hospital Stay (HOSPITAL_BASED_OUTPATIENT_CLINIC_OR_DEPARTMENT_OTHER)
Admission: EM | Admit: 2022-02-14 | Discharge: 2022-02-16 | DRG: 682 | Disposition: A | Discharge status: Home / Self Care | Payer: Medicare HMO | Attending: Family Medicine | Admitting: Family Medicine

## 2022-02-14 DIAGNOSIS — Z90710 Acquired absence of both cervix and uterus: Secondary | ICD-10-CM | POA: Diagnosis not present

## 2022-02-14 DIAGNOSIS — E782 Mixed hyperlipidemia: Secondary | ICD-10-CM | POA: Diagnosis not present

## 2022-02-14 DIAGNOSIS — N179 Acute kidney failure, unspecified: Principal | ICD-10-CM

## 2022-02-14 DIAGNOSIS — R9431 Abnormal electrocardiogram [ECG] [EKG]: Secondary | ICD-10-CM | POA: Diagnosis not present

## 2022-02-14 DIAGNOSIS — Z79899 Other long term (current) drug therapy: Secondary | ICD-10-CM

## 2022-02-14 DIAGNOSIS — F32A Depression, unspecified: Secondary | ICD-10-CM | POA: Diagnosis not present

## 2022-02-14 DIAGNOSIS — R001 Bradycardia, unspecified: Secondary | ICD-10-CM | POA: Diagnosis not present

## 2022-02-14 DIAGNOSIS — E876 Hypokalemia: Secondary | ICD-10-CM | POA: Diagnosis not present

## 2022-02-14 DIAGNOSIS — G3184 Mild cognitive impairment, so stated: Secondary | ICD-10-CM | POA: Diagnosis not present

## 2022-02-14 DIAGNOSIS — K5900 Constipation, unspecified: Secondary | ICD-10-CM | POA: Diagnosis not present

## 2022-02-14 DIAGNOSIS — N1832 Chronic kidney disease, stage 3b: Secondary | ICD-10-CM | POA: Diagnosis not present

## 2022-02-14 DIAGNOSIS — K5909 Other constipation: Secondary | ICD-10-CM | POA: Diagnosis present

## 2022-02-14 DIAGNOSIS — I10 Essential (primary) hypertension: Secondary | ICD-10-CM | POA: Diagnosis present

## 2022-02-14 DIAGNOSIS — U071 COVID-19: Secondary | ICD-10-CM | POA: Diagnosis not present

## 2022-02-14 DIAGNOSIS — I129 Hypertensive chronic kidney disease with stage 1 through stage 4 chronic kidney disease, or unspecified chronic kidney disease: Secondary | ICD-10-CM | POA: Diagnosis present

## 2022-02-14 DIAGNOSIS — Z7982 Long term (current) use of aspirin: Secondary | ICD-10-CM

## 2022-02-14 LAB — RESP PANEL BY RT-PCR (FLU A&B, COVID) ARPGX2
Influenza A by PCR: NEGATIVE
Influenza B by PCR: NEGATIVE
SARS Coronavirus 2 by RT PCR: POSITIVE — AB

## 2022-02-14 LAB — URINALYSIS, ROUTINE W REFLEX MICROSCOPIC
Bilirubin Urine: NEGATIVE
Glucose, UA: NEGATIVE mg/dL
Ketones, ur: NEGATIVE mg/dL
Leukocytes,Ua: NEGATIVE
Nitrite: NEGATIVE
Protein, ur: 100 mg/dL — AB
Specific Gravity, Urine: 1.02 (ref 1.005–1.030)
pH: 7.5 (ref 5.0–8.0)

## 2022-02-14 LAB — COMPREHENSIVE METABOLIC PANEL
ALT: 16 U/L (ref 0–44)
AST: 33 U/L (ref 15–41)
Albumin: 3.8 g/dL (ref 3.5–5.0)
Alkaline Phosphatase: 19 U/L — ABNORMAL LOW (ref 38–126)
Anion gap: 11 (ref 5–15)
BUN: 29 mg/dL — ABNORMAL HIGH (ref 8–23)
CO2: 32 mmol/L (ref 22–32)
Calcium: 9.3 mg/dL (ref 8.9–10.3)
Chloride: 95 mmol/L — ABNORMAL LOW (ref 98–111)
Creatinine, Ser: 2.16 mg/dL — ABNORMAL HIGH (ref 0.44–1.00)
GFR, Estimated: 23 mL/min — ABNORMAL LOW (ref >60)
Glucose, Bld: 106 mg/dL — ABNORMAL HIGH (ref 70–99)
Potassium: 2.8 mmol/L — ABNORMAL LOW (ref 3.5–5.1)
Sodium: 138 mmol/L (ref 135–145)
Total Bilirubin: 0.7 mg/dL (ref 0.3–1.2)
Total Protein: 7.3 g/dL (ref 6.5–8.1)

## 2022-02-14 LAB — CBC WITH DIFFERENTIAL/PLATELET
Abs Immature Granulocytes: 0.01 10*3/uL (ref 0.00–0.07)
Basophils Absolute: 0 10*3/uL (ref 0.0–0.1)
Basophils Relative: 1 %
Eosinophils Absolute: 0.1 10*3/uL (ref 0.0–0.5)
Eosinophils Relative: 3 %
HCT: 36.7 % (ref 36.0–46.0)
Hemoglobin: 12.6 g/dL (ref 12.0–15.0)
Immature Granulocytes: 0 %
Lymphocytes Relative: 27 %
Lymphs Abs: 1.3 10*3/uL (ref 0.7–4.0)
MCH: 29.5 pg (ref 26.0–34.0)
MCHC: 34.3 g/dL (ref 30.0–36.0)
MCV: 85.9 fL (ref 80.0–100.0)
Monocytes Absolute: 0.5 10*3/uL (ref 0.1–1.0)
Monocytes Relative: 11 %
Neutro Abs: 2.8 10*3/uL (ref 1.7–7.7)
Neutrophils Relative %: 58 %
Platelets: 213 10*3/uL (ref 150–400)
RBC: 4.27 MIL/uL (ref 3.87–5.11)
RDW: 13.3 % (ref 11.5–15.5)
WBC: 4.8 10*3/uL (ref 4.0–10.5)
nRBC: 0 % (ref 0.0–0.2)

## 2022-02-14 LAB — URINALYSIS, MICROSCOPIC (REFLEX)

## 2022-02-14 LAB — MAGNESIUM: Magnesium: 1.8 mg/dL (ref 1.7–2.4)

## 2022-02-14 MED ORDER — ASPIRIN EC 81 MG PO TBEC
81.0000 mg | DELAYED_RELEASE_TABLET | Freq: Every day | ORAL | Status: DC
  Administered 2022-02-15 – 2022-02-16 (×2): 81 mg via ORAL
  Filled 2022-02-14 (×2): qty 1

## 2022-02-14 MED ORDER — POTASSIUM CHLORIDE 10 MEQ/100ML IV SOLN
10.0000 meq | Freq: Once | INTRAVENOUS | Status: AC
Start: 2022-02-14 — End: 2022-02-14
  Administered 2022-02-14: 10 meq via INTRAVENOUS
  Filled 2022-02-14: qty 100

## 2022-02-14 MED ORDER — BISACODYL 5 MG PO TBEC
10.0000 mg | DELAYED_RELEASE_TABLET | Freq: Once | ORAL | Status: AC
  Administered 2022-02-14: 10 mg via ORAL
  Filled 2022-02-14: qty 2

## 2022-02-14 MED ORDER — TRIMETHOBENZAMIDE HCL 100 MG/ML IM SOLN: 200.0000 mg | Freq: Three times a day (TID) | INTRAMUSCULAR | Status: DC | PRN

## 2022-02-14 MED ORDER — POTASSIUM CHLORIDE CRYS ER 20 MEQ PO TBCR
40.0000 meq | EXTENDED_RELEASE_TABLET | Freq: Once | ORAL | Status: AC
  Administered 2022-02-14: 40 meq via ORAL
  Filled 2022-02-14: qty 2

## 2022-02-14 MED ORDER — POLYETHYLENE GLYCOL 3350 17 G PO PACK
34.0000 g | PACK | Freq: Once | ORAL | Status: AC
  Administered 2022-02-14: 34 g via ORAL
  Filled 2022-02-14: qty 2

## 2022-02-14 MED ORDER — LACTATED RINGERS IV BOLUS
1000.0000 mL | Freq: Once | INTRAVENOUS | Status: AC
  Administered 2022-02-14: 1000 mL via INTRAVENOUS

## 2022-02-14 MED ORDER — HEPARIN SODIUM (PORCINE) 5000 UNIT/ML IJ SOLN
5000.0000 [IU] | Freq: Three times a day (TID) | INTRAMUSCULAR | Status: DC
  Administered 2022-02-14 – 2022-02-16 (×5): 5000 [IU] via SUBCUTANEOUS
  Filled 2022-02-14 (×5): qty 1

## 2022-02-14 MED ORDER — ACETAMINOPHEN 650 MG RE SUPP
650.0000 mg | Freq: Four times a day (QID) | RECTAL | Status: DC | PRN
Start: 2022-02-14 — End: 2022-02-16

## 2022-02-14 MED ORDER — AMLODIPINE BESYLATE 5 MG PO TABS
5.0000 mg | ORAL_TABLET | Freq: Every day | ORAL | Status: DC
  Administered 2022-02-15 – 2022-02-16 (×2): 5 mg via ORAL
  Filled 2022-02-14 (×2): qty 1

## 2022-02-14 MED ORDER — LACTATED RINGERS IV SOLN: INTRAVENOUS | Status: AC

## 2022-02-14 MED ORDER — ACETAMINOPHEN 325 MG PO TABS: 650.0000 mg | ORAL_TABLET | Freq: Four times a day (QID) | ORAL | Status: DC | PRN

## 2022-02-14 NOTE — Assessment & Plan Note (Signed)
Po miralax. Po dulcolax and soap suds enema. Pt c/o she hasn't had BM in 2 weeks. No fecal impaction noted on CT abd today. ?

## 2022-02-14 NOTE — Assessment & Plan Note (Signed)
Continue norvasc 

## 2022-02-14 NOTE — ED Notes (Signed)
Report called to nurse on floor at [facility] and given to Wes at [facility].  ?

## 2022-02-14 NOTE — ED Provider Notes (Signed)
?[facility] EMERGENCY DEPARTMENT ?Provider Note ? ? ?CSN: [id number] ?Arrival date & time: 02/14/22  1257 ? ?  ? ?History ? ?Chief Complaint  ?Patient presents with  ? Constipation  ? ? ?Alexandra Griffith is a 81 y.o. female. ? ?Patient is a 81 year old female with a history of hypertension, hypercholesterolemia, depression and chronic constipation who is presenting today with complaint of 2 weeks of being unable to have a bowel movement, poor appetite and just feeling tired all the time.  She has not had significant nausea or vomiting.  She denies any abdominal pain.  She reports sometimes she goes to the bathroom but only gas comes out.  She denies any urinary symptoms.  She denies any new medications but does report that she got some boost yesterday and she is starting to drink that. ? ?The history is provided by the patient.  ?Constipation ? ?  ? ?Home Medications ?Prior to Admission medications   ?Medication Sig Start Date End Date Taking? Authorizing Provider  ?amLODipine (NORVASC) 5 MG tablet Take 1 tablet (5 mg total) by mouth daily. [id number]   Delora Fuel, MD  ?aspirin 81 MG EC tablet Take by mouth.    [provider]  ?ergocalciferol (VITAMIN D2) 1.25 MG (50000 UT) capsule Take 50,000 Units by mouth every 3 (three) days. 02/22/21 02/22/22  [provider]  ?mirtazapine (REMERON) 15 MG tablet Take 1 tablet (15 mg total) by mouth at bedtime. 04/04/21   Lavina Hamman, MD  ?Multiple Vitamin (MULTIVITAMIN WITH MINERALS) TABS tablet Take 1 tablet by mouth daily. 04/05/21   Lavina Hamman, MD  ?Nutritional Supplements (FEEDING SUPPLEMENT, BOOST BREEZE,) LIQD Take 1 each by mouth 3 (three) times daily. 04/04/21   Lavina Hamman, MD  ?potassium chloride SA (KLOR-CON) 20 MEQ tablet Take 1 tablet (20 mEq total) by mouth 2 (two) times daily. [id number]   Delora Fuel, MD  ?   ? ?Allergies    ?Patient has no known allergies.   ? ?Review of Systems   ?Review of Systems  ?Gastrointestinal:  Positive for  constipation.  ? ?Physical Exam ?Updated Vital Signs ?BP 135/66   Pulse (!) 52   Temp (!) 97.5 ?F (36.4 ?C) (Oral)   Resp 19   Ht 5\' 3"  (1.6 m)   Wt 70.3 kg   SpO2 97%   BMI 27.46 kg/m?  ?Physical Exam ?Vitals and nursing note reviewed.  ?Constitutional:   ?   General: She is not in acute distress. ?   Appearance: She is well-developed.  ?HENT:  ?   Head: Normocephalic and atraumatic.  ?   Mouth/Throat:  ?   Mouth: Mucous membranes are moist.  ?Eyes:  ?   Conjunctiva/sclera: Conjunctivae normal.  ?   Pupils: Pupils are equal, round, and reactive to light.  ?Cardiovascular:  ?   Rate and Rhythm: Normal rate and regular rhythm.  ?   Heart sounds: No murmur heard. ?Pulmonary:  ?   Effort: Pulmonary effort is normal. No respiratory distress.  ?   Breath sounds: Normal breath sounds. No wheezing or rales.  ?Abdominal:  ?   General: Bowel sounds are normal. There is no distension.  ?   Palpations: Abdomen is soft.  ?   Tenderness: There is no abdominal tenderness. There is no guarding or rebound.  ?Genitourinary: ?   Comments: Stool is brown and no evidence of fecal impaction ?Musculoskeletal:     ?   General: No tenderness. Normal range of motion.  ?  Cervical back: Normal range of motion and neck supple.  ?Skin: ?   General: Skin is warm and dry.  ?   Findings: No erythema or rash.  ?Neurological:  ?   Mental Status: She is alert and oriented to person, place, and time.  ?Psychiatric:     ?   Behavior: Behavior normal.  ? ? ?ED Results / Procedures / Treatments   ?Labs ?(all labs ordered are listed, but only abnormal results are displayed) ?Labs Reviewed  ?COMPREHENSIVE METABOLIC PANEL - Abnormal; Notable for the following components:  ?    Result Value  ? Potassium 2.8 (*)   ? Chloride 95 (*)   ? Glucose, Bld 106 (*)   ? BUN 29 (*)   ? Creatinine, Ser 2.16 (*)   ? Alkaline Phosphatase 19 (*)   ? GFR, Estimated 23 (*)   ? All other components within normal limits  ?CBC WITH DIFFERENTIAL/PLATELET  ?URINALYSIS,  ROUTINE W REFLEX MICROSCOPIC  ? ? ?EKG ?None ? ?Radiology ?No results found. ? ?Procedures ?Procedures  ? ? ?Medications Ordered in ED ?Medications  ?lactated ringers bolus 1,000 mL (1,000 mLs Intravenous New Bag/Given 02/14/22 1355)  ? ? ?ED Course/ Medical Decision Making/ A&P ?  ?                        ?Medical Decision Making ?Amount and/or Complexity of Data Reviewed ?External Data Reviewed: notes. ?Labs: ordered. Decision-making details documented in ED Course. ?Radiology: ordered. ? ?Risk ?Decision regarding hospitalization. ? ? ?Elderly female presenting today with complaints of no appetite, general fatigue and no bowel movements for the last 2 weeks.  Patient denies any weight loss is passing gas and has not had any vomiting.  She reports last week she had not eaten anything all day and she was shopping in [place] with her sister and she felt lightheaded and passed out at that time but has not had any other episodes of syncope.  She denies any chest pain or shortness of breath.  On exam her abdomen is benign.  No fecal impaction on rectal exam.  Low suspicion at this time for obstruction, diverticulitis.  Concern for severe constipation versus pancreatic mass or other cause of her poor appetite.  Also concern for possible dehydration, electrolyte abnormalities.  Patient given IV fluids.  Labs and imaging are pending.  External medical records from her PCP office a few months ago were reviewed. ? ?3:36 PM ?I independently interpreted patient's labs today patient has a normal CBC but CMP today with hypokalemia of 2.8, new acute kidney injury with a creatinine of 2.16 from 0.77 and elevated BUN.  Normal anion gap at this time.  Suspect significant dehydration and new AKI due to decreased input.  We will do a CT to ensure no new pancreatic masses, colonic masses or other causes of patient's poor appetite and no bowel movement.  Patient recently had thyroid studies done 2 months ago which were normal and low  suspicion for hypothyroid at this time.  Findings were discussed with the patient.  She is continue to receive IV fluids.  UA is still pending.  Discussed patient with Dr. Dina Rich who will follow-up on her care and admit the patient for further hydration and testing. ? ? ? ? ? ? ? ?Final Clinical Impression(s) / ED Diagnoses ?Final diagnoses:  ?None  ? ? ?Rx / DC Orders ?ED Discharge Orders   ? ? None  ? ?  ? ? ?  ?  Blanchie Dessert, MD ?02/14/22 1538 ? ?

## 2022-02-14 NOTE — Assessment & Plan Note (Signed)
Received 10 mg IV kcl and 40 meq po kcl. Will give another 40 meq po kcl. Repeat BMP in AM. ?

## 2022-02-14 NOTE — Assessment & Plan Note (Signed)
Stable. Pt not on any AV nodal blockers. ?

## 2022-02-14 NOTE — Assessment & Plan Note (Signed)
Pt has had cough for 3 weeks. Pt's sister coughing for 3 weeks. Pt denies any fever or chills. No SOB. Continue support care. No indication for decadron, remdesivir or oral covid meds. ?

## 2022-02-14 NOTE — Plan of Care (Signed)
  Problem: Education: Goal: Knowledge of General Education information will improve Description Including pain rating scale, medication(s)/side effects and non-pharmacologic comfort measures Outcome: Progressing   

## 2022-02-14 NOTE — Assessment & Plan Note (Addendum)
Could be due to hypokalemia, however past EKG in 07-2021 showed prolonged QTc . Replete K and repeat EKG in AM. ?

## 2022-02-14 NOTE — Assessment & Plan Note (Signed)
Stable

## 2022-02-14 NOTE — ED Provider Notes (Signed)
Patient signed out to me by previous provider. Please refer to their note for full HPI.  Briefly this is a 81 year old female who presented with original complaint of constipation, revealed decreased p.o. intake for the past 2 weeks.  Found to have an AKI with hypokalemia, fluids started.  Pending CT of the abdomen pelvis to rule out any acute renal abnormality and anticipate admission. ?Physical Exam  ?BP 128/88 (BP Location: Right Arm)   Pulse (!) 52   Temp (!) 97.5 ?F (36.4 ?C) (Oral)   Resp 16   Ht 5\' 3"  (1.6 m)   Wt 70.3 kg   SpO2 99%   BMI 27.46 kg/m?  ? ?Physical Exam ?Vitals and nursing note reviewed.  ?Constitutional:   ?   Appearance: Normal appearance.  ?HENT:  ?   Head: Normocephalic.  ?   Mouth/Throat:  ?   Mouth: Mucous membranes are moist.  ?Cardiovascular:  ?   Rate and Rhythm: Normal rate.  ?Pulmonary:  ?   Effort: Pulmonary effort is normal. No respiratory distress.  ?Abdominal:  ?   Palpations: Abdomen is soft.  ?   Tenderness: There is no abdominal tenderness.  ?Skin: ?   General: Skin is warm.  ?Neurological:  ?   Mental Status: She is alert and oriented to person, place, and time. Mental status is at baseline.  ?Psychiatric:     ?   Mood and Affect: Mood normal.  ? ? ?Procedures  ?Procedures ? ?ED Course / MDM  ?  ?Medical Decision Making ?Amount and/or Complexity of Data Reviewed ?Labs: ordered. ?Radiology: ordered. ? ?Risk ?Prescription drug management. ?Decision regarding hospitalization. ? ? ?CT of the abdomen pelvis rules out any acute finding.  After IV hydration patient has yet to produce a urine sample.  We will continue IV hydration and potassium repletion.  We will add on magnesium and COVID swab, patient has been accepted for admission.  Patients evaluation and results requires admission for further treatment and care.  Spoke with hospitalist, reviewed patient's ED course and they accept admission.  Patient agrees with admission plan, offers no new complaints and is  stable/unchanged at time of admit. ? ? ? ? ?  ? , DO ?02/14/22 1836 ? ?

## 2022-02-14 NOTE — ED Triage Notes (Signed)
Pt states unable to have a bowel movement in over 2 weeks, saw pmd, last instructed to take miralax, took for a few days and stopped.  Denies pain, does report weakness and poor appetite.  Denies n/v ?

## 2022-02-14 NOTE — Assessment & Plan Note (Signed)
Observation medical bed. Continue with IVF. Avoid nephrotoxic agents. Repeat BMP in AM. ?

## 2022-02-14 NOTE — H&P (Signed)
?History and Physical  ? ? ?Alexandra Griffith [id number] DOB: December 04, 1940 DOA: 02/14/2022 ? ?DOS: the patient was seen and examined on 02/14/2022 ? ?PCP: Sherrine Maples, MD  ? ?Patient coming from: Home ? ?I have personally briefly reviewed patient's old medical records in [place] ? ?CC: constipation for 2 weeks ?HPI: ?81 year old Afro-American female history of hypertension, hyperlipidemia, mild cognitive impairment, presents to the ER today with 2-week history of constipation.  Patient states she had a poor appetite and anorexia due to her constipation.  She also states that she has had a cough for about 3 weeks.  States that her sisters have the same cough.  States that her sister has been coughing on her. ? ?Patient denies any fever, chills, shortness of breath.  No diarrhea.  Patient has never had COVID before. ? ?In the ER temperature 97.5, heart rate 64, blood pressure 103/62 satting 99% on room air. ? ?Labs showed positive COVID, negative influenza ? ?UA was negative for infection ?CMP showed sodium 138, potassium 2.8, BUN 29, creatinine 2.1 ? ?WBC of 4.8, hemoglobin 12.6, platelets 213 ? ?CT abdomen pelvis demonstrated no acute intra-abdominal pathology. ? ?Due to the patient's mild AKI, hypokalemia, [facility] hospitalist contacted for admission. ? ?Patient perseverating over the fact that she has not had a bowel movement 2 weeks.  CT scan negative for any fecal impaction.  ? ?ED Course: covid +, CT abd negative. Scr 2.1, k 2.8 ? ?Review of Systems:  ?Review of Systems  ?Constitutional:  Positive for malaise/fatigue. Negative for chills and fever.  ?HENT: Negative.    ?Eyes: Negative.   ?Respiratory:  Positive for cough.   ?Cardiovascular: Negative.   ?Gastrointestinal:  Positive for constipation. Negative for abdominal pain and vomiting.  ?Genitourinary: Negative.   ?Musculoskeletal: Negative.   ?Skin: Negative.   ?Neurological: Negative.   ?Endo/Heme/Allergies: Negative.    ?Psychiatric/Behavioral: Negative.    ?All other systems reviewed and are negative. ? ?Past Medical History:  ?Diagnosis Date  ? Depression   ? High cholesterol   ? Hypertension   ? ? ?Past Surgical History:  ?Procedure Laterality Date  ? ABDOMINAL HYSTERECTOMY    ? ? ? reports that she has never smoked. She has never used smokeless tobacco. She reports current alcohol use. She reports that she does not use drugs. ? ?No Known Allergies ? ?Family History  ?Problem Relation Age of Onset  ? High blood pressure Mother   ?     8 total children 3 brothers deceased HTN, [place] and complications of agent orange, 1 sister deceased due to complications of dementia 2 sisters living  ? ? ?Prior to Admission medications   ?Medication Sig Start Date End Date Taking? Authorizing Provider  ?amLODipine (NORVASC) 5 MG tablet Take 1 tablet (5 mg total) by mouth daily. [id number]   Delora Fuel, MD  ?aspirin 81 MG EC tablet Take by mouth.    [provider]  ?ergocalciferol (VITAMIN D2) 1.25 MG (50000 UT) capsule Take 50,000 Units by mouth every 3 (three) days. 02/22/21 02/22/22  [provider]  ?mirtazapine (REMERON) 15 MG tablet Take 1 tablet (15 mg total) by mouth at bedtime. 04/04/21   Lavina Hamman, MD  ?Multiple Vitamin (MULTIVITAMIN WITH MINERALS) TABS tablet Take 1 tablet by mouth daily. 04/05/21   Lavina Hamman, MD  ?Nutritional Supplements (FEEDING SUPPLEMENT, BOOST BREEZE,) LIQD Take 1 each by mouth 3 (three) times daily. 04/04/21   Lavina Hamman, MD  ?potassium chloride SA (KLOR-CON)  20 MEQ tablet Take 1 tablet (20 mEq total) by mouth 2 (two) times daily. [id number]   Delora Fuel, MD  ? ? ?Physical Exam: ?Vitals:  ? 02/14/22 1945 02/14/22 2000 02/14/22 2015 02/14/22 2135  ?BP: (!) 102/59 (!) 110/55 (!) 104/58 110/69  ?Pulse: (!) 54 (!) 52 (!) 54 (!) 54  ?Resp: 20 16 19 17   ?Temp:    98.7 ?F (37.1 ?C)  ?TempSrc:    Oral  ?SpO2: (!) 89% 93% 90% 99%  ?Weight:      ?Height:      ? ? ?Physical Exam ?Vitals and nursing  note reviewed.  ?Constitutional:   ?   General: She is not in acute distress. ?   Appearance: Normal appearance. She is normal weight. She is not ill-appearing, toxic-appearing or diaphoretic.  ?HENT:  ?   Head: Normocephalic and atraumatic.  ?   Nose: Nose normal. No rhinorrhea.  ?Eyes:  ?   General:     ?   Right eye: No discharge.     ?   Left eye: No discharge.  ?Cardiovascular:  ?   Rate and Rhythm: Normal rate and regular rhythm.  ?   Pulses: Normal pulses.  ?Pulmonary:  ?   Effort: Pulmonary effort is normal. No respiratory distress.  ?   Breath sounds: Normal breath sounds. No wheezing or rales.  ?Abdominal:  ?   General: Bowel sounds are normal. There is no distension.  ?   Palpations: Abdomen is soft.  ?   Tenderness: There is no abdominal tenderness. There is no guarding or rebound.  ?Musculoskeletal:  ?   Right lower leg: No edema.  ?   Left lower leg: No edema.  ?Skin: ?   General: Skin is warm and dry.  ?   Capillary Refill: Capillary refill takes less than 2 seconds.  ?Neurological:  ?   General: No focal deficit present.  ?   Mental Status: She is alert and oriented to person, place, and time.  ?  ? ?Labs on Admission: I have personally reviewed following labs and imaging studies ? ?CBC: ?Recent Labs  ?Lab 02/14/22 ?1350  ?WBC 4.8  ?NEUTROABS 2.8  ?HGB 12.6  ?HCT 36.7  ?MCV 85.9  ?PLT 213  ? ?Basic Metabolic Panel: ?Recent Labs  ?Lab 02/14/22 ?1350  ?NA 138  ?K 2.8*  ?CL 95*  ?CO2 32  ?GLUCOSE 106*  ?BUN 29*  ?CREATININE 2.16*  ?CALCIUM 9.3  ?MG 1.8  ? ?GFR: ?Estimated Creatinine Clearance: 19.9 mL/min (A) (by C-G formula based on SCr of 2.16 mg/dL (H)). ?Liver Function Tests: ?Recent Labs  ?Lab 02/14/22 ?1350  ?AST 33  ?ALT 16  ?ALKPHOS 19*  ?BILITOT 0.7  ?PROT 7.3  ?ALBUMIN 3.8  ? ?No results for input(s): LIPASE, AMYLASE in the last 168 hours. ?No results for input(s): AMMONIA in the last 168 hours. ?Coagulation Profile: ?No results for input(s): INR, PROTIME in the last 168 hours. ?Cardiac  Enzymes: ?No results for input(s): CKTOTAL, CKMB, CKMBINDEX, TROPONINI, TROPONINIHS in the last 168 hours. ?BNP (last 3 results) ?No results for input(s): PROBNP in the last 8760 hours. ?HbA1C: ?No results for input(s): HGBA1C in the last 72 hours. ?CBG: ?No results for input(s): GLUCAP in the last 168 hours. ?Lipid Profile: ?No results for input(s): CHOL, HDL, LDLCALC, TRIG, CHOLHDL, LDLDIRECT in the last 72 hours. ?Thyroid Function Tests: ?No results for input(s): TSH, T4TOTAL, FREET4, T3FREE, THYROIDAB in the last 72 hours. ?Anemia Panel: ?No results for input(s): VITAMINB12,  FOLATE, FERRITIN, TIBC, IRON, RETICCTPCT in the last 72 hours. ?Urine analysis: ?   ?Component Value Date/Time  ? [place] YELLOW 02/14/2022 2023  ? APPEARANCEUR CLEAR 02/14/2022 2023  ? LABSPEC 1.020 02/14/2022 2023  ? PHURINE 7.5 02/14/2022 2023  ? GLUCOSEU NEGATIVE 02/14/2022 2023  ? HGBUR TRACE (A) 02/14/2022 2023  ? [place] NEGATIVE 02/14/2022 2023  ? [place] NEGATIVE 02/14/2022 2023  ? PROTEINUR 100 (A) 02/14/2022 2023  ? NITRITE NEGATIVE 02/14/2022 2023  ? LEUKOCYTESUR NEGATIVE 02/14/2022 2023  ? ? ?Radiological Exams on Admission: I have personally reviewed images ?CT ABDOMEN PELVIS WO CONTRAST ? ?Result Date: 02/14/2022 ?CLINICAL DATA:  Nausea and vomiting.  Poor appetite.  Fatigue. EXAM: CT ABDOMEN AND PELVIS WITHOUT CONTRAST TECHNIQUE: Multidetector CT imaging of the abdomen and pelvis was performed following the standard protocol without IV contrast. RADIATION DOSE REDUCTION: This exam was performed according to the departmental dose-optimization program which includes automated exposure control, adjustment of the mA and/or kV according to patient size and/or use of iterative reconstruction technique. COMPARISON:  04/02/2021 FINDINGS: Lower chest: No acute finding. Hepatobiliary: Liver parenchyma is normal without contrast. No calcified gallstones. Pancreas: Normal Spleen: Normal Adrenals/Urinary Tract: Adrenal glands are  normal. Kidneys are normal. No cyst, mass, stone or hydronephrosis. Bladder is normal. Stomach/Bowel: Stomach and small intestine are normal. Diverticulosis of the colon without evidence of diverticulitis. Vascular/Lymphatic: Ao

## 2022-02-14 NOTE — Progress Notes (Signed)
New Admission Note:  ? ?Arrival Method: Arrived from [facility] ED via [facility] ?Mental Orientation: Alert and oriented x4 ?Telemetry: Box #[phone] ?Assessment: Completed ?Skin: Intact ?IV: NSL-Rt FA ?Pain: 0/10 ?Tubes: N/A ?Safety Measures: Safety Fall Prevention Plan has been discussed.  ?Admission: Completed ? Orientation: Patient has been oriented to the room, unit and staff.  ?Family: None at bedside ? ?Orders have been reviewed and implemented. Will continue to monitor the patient. Call light has been placed within reach and bed alarm has been activated.  ? ?Licensed conveyancer, RN-BC ?Phone number: [phone]  ?

## 2022-02-14 NOTE — Subjective & Objective (Signed)
CC: constipation for 2 weeks ?HPI: ?81 year old Afro-American female history of hypertension, hyperlipidemia, mild cognitive impairment, presents to the ER today with 2-week history of constipation.  Patient states she had a poor appetite and anorexia due to her constipation.  She also states that she has had a cough for about 3 weeks.  States that her sisters have the same cough.  States that her sister has been coughing on her. ? ?Patient denies any fever, chills, shortness of breath.  No diarrhea.  Patient has never had COVID before. ? ?In the ER temperature 97.5, heart rate 64, blood pressure 103/62 satting 99% on room air. ? ?Labs showed positive COVID, negative influenza ? ?UA was negative for infection ?CMP showed sodium 138, potassium 2.8, BUN 29, creatinine 2.1 ? ?WBC of 4.8, hemoglobin 12.6, platelets 213 ? ?CT abdomen pelvis demonstrated no acute intra-abdominal pathology. ? ?Due to the patient's mild AKI, hypokalemia, [facility] hospitalist contacted for admission. ? ?Patient perseverating over the fact that she has not had a bowel movement 2 weeks.  CT scan negative for any fecal impaction. ?

## 2022-02-15 DIAGNOSIS — K5909 Other constipation: Secondary | ICD-10-CM | POA: Diagnosis present

## 2022-02-15 DIAGNOSIS — I129 Hypertensive chronic kidney disease with stage 1 through stage 4 chronic kidney disease, or unspecified chronic kidney disease: Secondary | ICD-10-CM | POA: Diagnosis present

## 2022-02-15 DIAGNOSIS — R001 Bradycardia, unspecified: Secondary | ICD-10-CM | POA: Diagnosis present

## 2022-02-15 DIAGNOSIS — N1832 Chronic kidney disease, stage 3b: Secondary | ICD-10-CM | POA: Diagnosis present

## 2022-02-15 DIAGNOSIS — Z90710 Acquired absence of both cervix and uterus: Secondary | ICD-10-CM | POA: Diagnosis not present

## 2022-02-15 DIAGNOSIS — R9431 Abnormal electrocardiogram [ECG] [EKG]: Secondary | ICD-10-CM | POA: Diagnosis present

## 2022-02-15 DIAGNOSIS — E876 Hypokalemia: Secondary | ICD-10-CM | POA: Diagnosis present

## 2022-02-15 DIAGNOSIS — F32A Depression, unspecified: Secondary | ICD-10-CM | POA: Diagnosis present

## 2022-02-15 DIAGNOSIS — U071 COVID-19: Secondary | ICD-10-CM | POA: Diagnosis present

## 2022-02-15 DIAGNOSIS — E782 Mixed hyperlipidemia: Secondary | ICD-10-CM | POA: Diagnosis present

## 2022-02-15 DIAGNOSIS — Z7982 Long term (current) use of aspirin: Secondary | ICD-10-CM | POA: Diagnosis not present

## 2022-02-15 DIAGNOSIS — N179 Acute kidney failure, unspecified: Secondary | ICD-10-CM | POA: Diagnosis present

## 2022-02-15 DIAGNOSIS — G3184 Mild cognitive impairment, so stated: Secondary | ICD-10-CM | POA: Diagnosis present

## 2022-02-15 DIAGNOSIS — Z79899 Other long term (current) drug therapy: Secondary | ICD-10-CM | POA: Diagnosis not present

## 2022-02-15 LAB — CBC WITH DIFFERENTIAL/PLATELET
Abs Immature Granulocytes: 0.01 10*3/uL (ref 0.00–0.07)
Basophils Absolute: 0 10*3/uL (ref 0.0–0.1)
Basophils Relative: 1 %
Eosinophils Absolute: 0.1 10*3/uL (ref 0.0–0.5)
Eosinophils Relative: 2 %
HCT: 34.9 % — ABNORMAL LOW (ref 36.0–46.0)
Hemoglobin: 12.2 g/dL (ref 12.0–15.0)
Immature Granulocytes: 0 %
Lymphocytes Relative: 33 %
Lymphs Abs: 1.5 10*3/uL (ref 0.7–4.0)
MCH: 29.7 pg (ref 26.0–34.0)
MCHC: 35 g/dL (ref 30.0–36.0)
MCV: 84.9 fL (ref 80.0–100.0)
Monocytes Absolute: 0.7 10*3/uL (ref 0.1–1.0)
Monocytes Relative: 14 %
Neutro Abs: 2.2 10*3/uL (ref 1.7–7.7)
Neutrophils Relative %: 50 %
Platelets: 190 10*3/uL (ref 150–400)
RBC: 4.11 MIL/uL (ref 3.87–5.11)
RDW: 12.9 % (ref 11.5–15.5)
WBC: 4.5 10*3/uL (ref 4.0–10.5)
nRBC: 0 % (ref 0.0–0.2)

## 2022-02-15 LAB — COMPREHENSIVE METABOLIC PANEL
ALT: 15 U/L (ref 0–44)
AST: 27 U/L (ref 15–41)
Albumin: 3.4 g/dL — ABNORMAL LOW (ref 3.5–5.0)
Alkaline Phosphatase: 14 U/L — ABNORMAL LOW (ref 38–126)
Anion gap: 12 (ref 5–15)
BUN: 17 mg/dL (ref 8–23)
CO2: 26 mmol/L (ref 22–32)
Calcium: 9.2 mg/dL (ref 8.9–10.3)
Chloride: 99 mmol/L (ref 98–111)
Creatinine, Ser: 1.45 mg/dL — ABNORMAL HIGH (ref 0.44–1.00)
GFR, Estimated: 37 mL/min — ABNORMAL LOW (ref >60)
Glucose, Bld: 97 mg/dL (ref 70–99)
Potassium: 3.2 mmol/L — ABNORMAL LOW (ref 3.5–5.1)
Sodium: 137 mmol/L (ref 135–145)
Total Bilirubin: 0.6 mg/dL (ref 0.3–1.2)
Total Protein: 6.3 g/dL — ABNORMAL LOW (ref 6.5–8.1)

## 2022-02-15 LAB — MAGNESIUM: Magnesium: 1.5 mg/dL — ABNORMAL LOW (ref 1.7–2.4)

## 2022-02-15 MED ORDER — POTASSIUM CHLORIDE CRYS ER 20 MEQ PO TBCR
40.0000 meq | EXTENDED_RELEASE_TABLET | Freq: Once | ORAL | Status: AC
  Administered 2022-02-15: 40 meq via ORAL
  Filled 2022-02-15: qty 2

## 2022-02-15 MED ORDER — LACTATED RINGERS IV SOLN: INTRAVENOUS | Status: DC

## 2022-02-15 MED ORDER — ENSURE ENLIVE PO LIQD
237.0000 mL | Freq: Three times a day (TID) | ORAL | Status: DC
  Administered 2022-02-15 – 2022-02-16 (×2): 237 mL via ORAL

## 2022-02-15 MED ORDER — POLYETHYLENE GLYCOL 3350 17 G PO PACK
17.0000 g | PACK | Freq: Every day | ORAL | Status: DC
  Administered 2022-02-15 – 2022-02-16 (×2): 17 g via ORAL
  Filled 2022-02-15 (×2): qty 1

## 2022-02-15 MED ORDER — POTASSIUM CHLORIDE CRYS ER 20 MEQ PO TBCR
20.0000 meq | EXTENDED_RELEASE_TABLET | Freq: Every day | ORAL | Status: DC
  Administered 2022-02-15 – 2022-02-16 (×2): 20 meq via ORAL
  Filled 2022-02-15 (×2): qty 1

## 2022-02-15 MED ORDER — ADULT MULTIVITAMIN W/MINERALS CH
1.0000 | ORAL_TABLET | Freq: Every day | ORAL | Status: DC
  Administered 2022-02-16: 1 via ORAL
  Filled 2022-02-15: qty 1

## 2022-02-15 NOTE — Hospital Course (Signed)
81 year old Afro-American female w/ HTN HLD,mild cognitive impairment, presents to the ER  02/14/22 with 2-week history of constipation also had poor appetite anorexia due to constipation, has had mild cough x3 weeks. ?In the ER temperature 97.5, heart rate 64, blood pressure 103/62 satting 99% on room air.Labs showed positive COVID, negative influenza UA was negative for infection. CMP showed sodium 138, potassium 2.8, BUN 29, creatinine 2.1-baseline 0.78/722-prior to that was in 1.3 range. WBC of 4.8, hemoglobin 12.6, platelets 213CT abdomen pelvis demonstrated no acute intra-abdominal pathology. ?Patient was admitted for mild AKI constipation hypokalemia. With IV fluids creatinine improving potassium improving ?

## 2022-02-15 NOTE — Progress Notes (Signed)
Initial Nutrition Assessment ? ?DOCUMENTATION CODES:  ? ?Not applicable ? ?INTERVENTION:  ? ?Ensure Enlive po TID, each supplement provides 350 kcal and 20 grams of protein. ? ?MVI with minerals daily. ? ?NUTRITION DIAGNOSIS:  ? ?Inadequate oral intake related to decreased appetite, constipation as evidenced by per patient/family report. ? ?GOAL:  ? ?Patient will meet greater than or equal to 90% of their needs ? ?MONITOR:  ? ?PO intake, Supplement acceptance, Labs ? ?REASON FOR ASSESSMENT:  ? ?Malnutrition Screening Tool ?  ? ?ASSESSMENT:  ? ?81 yo female admitted with AKI, constipation, COVID positive. PMH includes HTN, HLD, depression. ? ?Patient was constipated on admission with no BM x 2 weeks.  ?She c/o decreased appetite and intake recently; likely related to constipation. ?She agreed to drink Ensure supplements between meals. ?Weight history reviewed. No significant weight changes noted. ?Meal intakes recorded at 75% x 1 meal this morning.  ? ?Labs reviewed. K 3.2, mag 1.5 ?Medications reviewed. ? ?NUTRITION - FOCUSED PHYSICAL EXAM: ? ?Flowsheet Row Most Recent Value  ?Orbital Region No depletion  ?Upper Arm Region No depletion  ?Thoracic and Lumbar Region No depletion  ?Buccal Region No depletion  ?Temple Region No depletion  ?Clavicle Bone Region Mild depletion  ?Clavicle and Acromion Bone Region No depletion  ?Scapular Bone Region No depletion  ?Dorsal Hand Mild depletion  ?Patellar Region No depletion  ?Anterior Thigh Region No depletion  ?Posterior Calf Region Mild depletion  ?Edema (RD Assessment) None  ?Hair Reviewed  ?Eyes Reviewed  ?Mouth Reviewed  ?Skin Reviewed  ?Nails Reviewed  ? ?  ? ? ?Diet Order:   ?Diet Order   ? ?       ?  Diet regular Room service appropriate? Yes; Fluid consistency: Thin  Diet effective now       ?  ? ?  ?  ? ?  ? ? ?EDUCATION NEEDS:  ? ?No education needs have been identified at this time ? ?Skin:  Skin Assessment: Reviewed RN Assessment ? ?Last BM:  3/17 ? ?Height:   ? ?Ht Readings from Last 1 Encounters:  ?02/14/22 5\' 3"  (1.6 m)  ? ? ?Weight:  ? ?Wt Readings from Last 1 Encounters:  ?02/14/22 70.3 kg  ? ? ? ?BMI:  Body mass index is 27.46 kg/m?. ? ?Estimated Nutritional Needs:  ? ?Kcal:  1850-2050 ? ?Protein:  85-100 gm ? ?Fluid:  >/= 1.9 L ? ? ? ?Lucas Mallow RD, LDN, CNSC ?Please refer to Amion for contact information.                                                       ? ?

## 2022-02-15 NOTE — Progress Notes (Signed)
?PROGRESS NOTE ?Alexandra Griffith  PQZ:[id number] DOB: 1941-05-22 DOA: 02/14/2022 ?PCP: Spero Geralds, MD  ? ?Brief Narrative/Hospital Course: ?81 year old Afro-American female w/ HTN HLD,mild cognitive impairment, presents to the ER  02/14/22 with 2-week history of constipation also had poor appetite anorexia due to constipation, has had mild cough x3 weeks. ?In the ER temperature 97.5, heart rate 64, blood pressure 103/62 satting 99% on room air.Labs showed positive COVID, negative influenza UA was negative for infection. CMP showed sodium 138, potassium 2.8, BUN 29, creatinine 2.1-baseline 0.78/722-prior to that was in 1.3 range. WBC of 4.8, hemoglobin 12.6, platelets 213CT abdomen pelvis demonstrated no acute intra-abdominal pathology. ?Patient was admitted for mild AKI constipation hypokalemia. With IV fluids creatinine improving potassium improving  ?  ?Subjective: ?Seen and examined this morning.  Had a few bowel movements with small soft, feels like she has to have more bowel movement.  Creatinine slowly improving. ? ?Assessment and Plan: ?Principal Problem: ?  AKI (acute kidney injury) (HCC) ?Active Problems: ?  Hypokalemia ?  COVID-19 virus infection ?  Constipation ?  Essential hypertension ?  Mixed hyperlipidemia ?  Prolonged QT interval ?  Sinus bradycardia ?  ?AKI : Due to constipation related poor oral intake and anorexia.  Creatinine nicely improving baseline 0.7, continue IV fluid hydration encourage oral intake.  Constipation improving ? ?Constipation: Had some bowel movement but still feels like she needs to have more, continue aggressive bowel regimen.  She had no bowel movement 2 weeks prior to admission.  CT abdomen reviewed in the ED. ? ?COVID-19 virus infection:Currently asymptomatic.has had cough for 3 weeks. Pt's sister coughing for 3 weeks.Continue support care. No indication for decadron, remdesivir or oral covid meds. ? ?Hypokalemia:replete ?Sinus bradycardia:stable, cont to avoid   AV nodal blockers. ? ?Prolonged QT interval:Replete electrolytes monitor. ?Mixed hyperlipidemia Stable. ?Essential hypertension: Stable cont norvasc ?DVT prophylaxis: heparin injection 5,000 Units Start: 02/14/22 2345 ?SCDs Start: 02/14/22 2256 ?Code Status:   Code Status: Full Code ?Family Communication: plan of care discussed with patient at bedside. ?Disposition: Currently not medically stable for discharge. ?Status is: Observation level of care: Telemetry Medical  ?Remains inpatient because: Remains hospitalized for more than 2 midnights for ongoing IV fluid hydration management of constipation and AKI ?Dispo: The patient is from: home ?           Anticipated d/c is to: home tomorrow ?           Patient currently not stable ? ?Mobility Assessment (last 72 hours)   ? ? Mobility Assessment   ? ? Row Name 02/14/22 2141  ?  ?  ?  ?  ? Does patient have an order for bedrest or is patient medically unstable No - Continue assessment      ? What is the highest level of mobility based on the progressive mobility assessment? Level 6 (Walks independently in room and hall) - Balance while walking in room without assist - Complete      ? ?  ?  ? ?  ?  ? ?Objective: ?Vitals last 24 hrs: ?Vitals:  ? 02/14/22 2135 02/15/22 0016 02/15/22 0412 02/15/22 0917  ?BP: 110/69 (!) 112/59 109/65 132/68  ?Pulse: (!) 54 63 (!) 57 (!) 57  ?Resp: 17 18 17 18   ?Temp: 98.7 ?F (37.1 ?C) 99.3 ?F (37.4 ?C) 99 ?F (37.2 ?C)   ?TempSrc: Oral Oral Oral   ?SpO2: 99% 100% 95% 100%  ?Weight:      ?Height: 5\' 3"  (1.6 m)     ? ?  Weight change:  ? ?Physical Examination: ?General exam: AA0X3,older than stated age, weak appearing. ?HEENT:Oral mucosa moist, Ear/Nose WNL grossly, dentition normal. ?Respiratory system: bilaterally clear BS, no use of accessory muscle ?Cardiovascular system: S1 & S2 +, No JVD,. ?Gastrointestinal system: Abdomen soft,NT,ND, BS+ ?Nervous System:Alert, awake, moving extremities and grossly nonfocal ?Extremities: LE edema none,distal  peripheral pulses palpable.  ?Skin: No rashes,no icterus. ?MSK: Normal muscle bulk,tone, power ? ?Medications reviewed:  ?Scheduled Meds: ? amLODipine  5 mg Oral Daily  ? aspirin EC  81 mg Oral Daily  ? heparin  5,000 Units Subcutaneous Q8H  ? potassium chloride  20 mEq Oral Daily  ? ?Continuous Infusions: ? lactated ringers 75 mL/hr at 02/15/22 1056  ? ? ?  ?Diet Order   ? ?       ?  Diet regular Room service appropriate? Yes; Fluid consistency: Thin  Diet effective now       ?  ? ?  ?  ? ?  ?  ? ?  ?  ?  ? ? ?Intake/Output Summary (Last 24 hours) at 02/15/2022 1144 ?Last data filed at 02/15/2022 1000 ?Gross per 24 hour  ?Intake 1310 ml  ?Output 727 ml  ?Net 583 ml  ? ?Net IO Since Admission: 583 mL [02/15/22 1144]  ?Wt Readings from Last 3 Encounters:  ?02/14/22 70.3 kg  ?07/07/21 73.5 kg  ?04/02/21 67.2 kg  ?  ? ?Unresulted Labs (From admission, onward)  ? ? None  ? ?  ?Data Reviewed: I have personally reviewed following labs and imaging studies ?CBC: ?Recent Labs  ?Lab 02/14/22 ?1350 02/15/22 ?[id number]  ?WBC 4.8 4.5  ?NEUTROABS 2.8 2.2  ?HGB 12.6 12.2  ?HCT 36.7 34.9*  ?MCV 85.9 84.9  ?PLT 213 190  ? ?Basic Metabolic Panel: ?Recent Labs  ?Lab 02/14/22 ?1350 02/15/22 ?[id number]  ?NA 138 137  ?K 2.8* 3.2*  ?CL 95* 99  ?CO2 32 26  ?GLUCOSE 106* 97  ?BUN 29* 17  ?CREATININE 2.16* 1.45*  ?CALCIUM 9.3 9.2  ?MG 1.8 1.5*  ? ?GFR: ?Estimated Creatinine Clearance: 29.6 mL/min (A) (by C-G formula based on SCr of 1.45 mg/dL (H)). ?Liver Function Tests: ?Recent Labs  ?Lab 02/14/22 ?1350 02/15/22 ?[id number]  ?AST 33 27  ?ALT 16 15  ?ALKPHOS 19* 14*  ?BILITOT 0.7 0.6  ?PROT 7.3 6.3*  ?ALBUMIN 3.8 3.4*  ? ?No results for input(s): LIPASE, AMYLASE in the last 168 hours. ?No results for input(s): AMMONIA in the last 168 hours. ?Coagulation Profile: ?No results for input(s): INR, PROTIME in the last 168 hours. ?BNP (last 3 results) ?No results for input(s): PROBNP in the last 8760 hours. ?HbA1C: ?No results for input(s): HGBA1C in the last 72  hours. ?CBG: ?No results for input(s): GLUCAP in the last 168 hours. ?Lipid Profile: ?No results for input(s): CHOL, HDL, LDLCALC, TRIG, CHOLHDL, LDLDIRECT in the last 72 hours. ?Thyroid Function Tests: ?No results for input(s): TSH, T4TOTAL, FREET4, T3FREE, THYROIDAB in the last 72 hours. ?Sepsis Labs: ?No results for input(s): PROCALCITON, LATICACIDVEN in the last 168 hours. ? ?Recent Results (from the past 240 hour(s))  ?Resp Panel by RT-PCR (Flu A&B, Covid) Nasopharyngeal Swab     Status: Abnormal  ? Collection Time: 02/14/22  6:42 PM  ? Specimen: Nasopharyngeal Swab; Nasopharyngeal(NP) swabs in vial transport medium  ?Result Value Ref Range Status  ? SARS Coronavirus 2 by RT PCR POSITIVE (A) NEGATIVE Final  ?  Comment: (NOTE) ?SARS-CoV-2 target nucleic acids are DETECTED. ? ?The SARS-CoV-2 RNA  is generally detectable in upper respiratory ?specimens during the acute phase of infection. Positive results are ?indicative of the presence of the identified virus, but do not rule ?out bacterial infection or co-infection with other pathogens not ?detected by the test. Clinical correlation with patient history and ?other diagnostic information is necessary to determine patient ?infection status. The expected result is Negative. ? ?Fact Sheet for Patients: ?BloggerCourse.com ? ?Fact Sheet for Healthcare Providers: ?SeriousBroker.it ? ?This test is not yet approved or cleared by the [place] FDA and  ?has been authorized for detection and/or diagnosis of SARS-CoV-2 by ?FDA under an Emergency Use Authorization (EUA).  This EUA will ?remain in effect (meaning this test can be used) for the duration of  ?the COVID-19 declaration under Section 564(b)(1) of the [place], 21 ?[place] section 360bbb-3(b)(1), unless the authorization is ?terminated or revoked sooner. ? ?  ? Influenza A by PCR NEGATIVE NEGATIVE Final  ? Influenza B by PCR NEGATIVE NEGATIVE Final  ?  Comment:  (NOTE) ?The Xpert Xpress SARS-CoV-2/FLU/RSV plus assay is intended as an aid ?in the diagnosis of influenza from Nasopharyngeal swab specimens and ?should not be used as a sole basis for treatment. Nasal washin

## 2022-02-16 DIAGNOSIS — N1832 Chronic kidney disease, stage 3b: Secondary | ICD-10-CM

## 2022-02-16 DIAGNOSIS — N179 Acute kidney failure, unspecified: Secondary | ICD-10-CM | POA: Diagnosis not present

## 2022-02-16 LAB — BASIC METABOLIC PANEL
Anion gap: 9 (ref 5–15)
BUN: 10 mg/dL (ref 8–23)
CO2: 23 mmol/L (ref 22–32)
Calcium: 9.5 mg/dL (ref 8.9–10.3)
Chloride: 105 mmol/L (ref 98–111)
Creatinine, Ser: 1.25 mg/dL — ABNORMAL HIGH (ref 0.44–1.00)
GFR, Estimated: 44 mL/min — ABNORMAL LOW (ref >60)
Glucose, Bld: 113 mg/dL — ABNORMAL HIGH (ref 70–99)
Potassium: 3.6 mmol/L (ref 3.5–5.1)
Sodium: 137 mmol/L (ref 135–145)

## 2022-02-16 LAB — MAGNESIUM: Magnesium: 1.3 mg/dL — ABNORMAL LOW (ref 1.7–2.4)

## 2022-02-16 MED ORDER — FLEET ENEMA 7-19 GM/118ML RE ENEM
1.0000 | ENEMA | Freq: Once | RECTAL | Status: DC
  Filled 2022-02-16: qty 1

## 2022-02-16 NOTE — Evaluation (Signed)
Occupational Therapy Evaluation ?Patient Details ?Name: Alexandra Griffith ?MRN: [id number] ?DOB: Apr 07, 1941 ?Today's Date: 02/16/2022 ? ? ?History of Present Illness 81 y.o. F admitted on 02/14/22 due to constipation, with poor appetite. Found to be Covid +. PMH significant for HTN, HLD, mild cog impairment.  ? ?Clinical Impression ?  ?Pt admitted for concerns listed above. PTA pt reported that she was independent with all ADL's and IADL's. At this time, pt presents at her baseline, able to complete all BADL's and functional mobility independently with no AD. Pt has no further OT needs and acute OT will sign off.   ?   ? ?Recommendations for follow up therapy are one component of a multi-disciplinary discharge planning process, led by the attending physician.  Recommendations may be updated based on patient status, additional functional criteria and insurance authorization.  ? ?Follow Up Recommendations ? No OT follow up  ?  ?Assistance Recommended at Discharge None  ?Patient can return home with the following Assistance with cooking/housework ? ?  ?Functional Status Assessment ? Patient has not had a recent decline in their functional status  ?Equipment Recommendations ? None recommended by OT  ?  ?Recommendations for Other Services   ? ? ?  ?Precautions / Restrictions Precautions ?Precautions: Fall ?Restrictions ?Weight Bearing Restrictions: No  ? ?  ? ?Mobility Bed Mobility ?Overal bed mobility: Independent ?  ?  ?  ?  ?  ?  ?  ?  ? ?Transfers ?Overall transfer level: Independent ?Equipment used: None ?  ?  ?  ?  ?  ?  ?  ?  ?  ? ?  ?Balance Overall balance assessment: No apparent balance deficits (not formally assessed) ?  ?  ?  ?  ?  ?  ?  ?  ?  ?  ?  ?  ?  ?  ?  ?  ?  ?  ?   ? ?ADL either performed or assessed with clinical judgement  ? ?ADL Overall ADL's : At baseline;Independent ?  ?  ?  ?  ?  ?  ?  ?  ?  ?  ?  ?  ?  ?  ?  ?  ?  ?  ?  ?General ADL Comments: No assist needed. At this time, pt remains independent with  all ADL's and functional mobility, using no AD  ? ? ? ?Vision Baseline Vision/History: 0 No visual deficits ?Ability to See in Adequate Light: 0 Adequate ?Patient Visual Report: No change from baseline ?Vision Assessment?: No apparent visual deficits  ?   ?Perception   ?  ?Praxis   ?  ? ?Pertinent Vitals/Pain Pain Assessment ?Pain Assessment: No/denies pain  ? ? ? ?Hand Dominance Right ?  ?Extremity/Trunk Assessment Upper Extremity Assessment ?Upper Extremity Assessment: Overall WFL for tasks assessed ?  ?Lower Extremity Assessment ?Lower Extremity Assessment: Overall WFL for tasks assessed ?  ?Cervical / Trunk Assessment ?Cervical / Trunk Assessment: Normal ?  ?Communication Communication ?Communication: No difficulties ?  ?Cognition Arousal/Alertness: Awake/alert ?Behavior During Therapy: [facility] for tasks assessed/performed ?Overall Cognitive Status: No family/caregiver present to determine baseline cognitive functioning ?  ?  ?  ?  ?  ?  ?  ?  ?  ?  ?  ?  ?  ?  ?  ?  ?General Comments: Pt does not understand why her Covid is not being treated, despite MD explaination. ADditionally, pt continually stating that she is constipated, however also stating that she has been  having some diarhea. ?  ?  ?General Comments  VSS on RA, MD entered room during session to dc pt. ? ?  ?Exercises   ?  ?Shoulder Instructions    ? ? ?Home Living Family/patient expects to be discharged to:: Private residence ?Living Arrangements: Other relatives ?Available Help at Discharge: Family;Available 24 hours/day ?Type of Home: House ?Home Access: Level entry ?  ?  ?Home Layout: One level ?  ?  ?Bathroom Shower/Tub: Walk-in shower ?  ?Bathroom Toilet: Handicapped height ?  ?  ?Home Equipment: None ?  ?  ?  ? ?  ?Prior Functioning/Environment Prior Level of Function : Independent/Modified Independent;Driving ?  ?  ?  ?  ?  ?  ?  ?  ?  ? ?  ?  ?OT Problem List: Decreased activity tolerance;Decreased cognition ?  ?   ?OT Treatment/Interventions:     ?  ?OT Goals(Current goals can be found in the care plan section) Acute Rehab OT Goals ?Patient Stated Goal: To go home ?OT Goal Formulation: All assessment and education complete, DC therapy ?Time For Goal Achievement: 02/16/22 ?Potential to Achieve Goals: Good  ?OT Frequency:   ?  ? ?Co-evaluation   ?  ?  ?  ?  ? ?  ?AM-PAC OT "6 Clicks" Daily Activity     ?Outcome Measure Help from another person eating meals?: None ?Help from another person taking care of personal grooming?: None ?Help from another person toileting, which includes using toliet, bedpan, or urinal?: None ?Help from another person bathing (including washing, rinsing, drying)?: None ?Help from another person to put on and taking off regular upper body clothing?: None ?Help from another person to put on and taking off regular lower body clothing?: None ?6 Click Score: 24 ?  ?End of Session Nurse Communication: Mobility status ? ?Activity Tolerance: Patient tolerated treatment well ?Patient left: in bed;with call bell/phone within reach ? ?OT Visit Diagnosis: Muscle weakness (generalized) (M62.81);Other abnormalities of gait and mobility (R26.89)  ?              ?Time: 1025-8527 ?OT Time Calculation (min): 20 min ?Charges:  OT General Charges ?$OT Visit: 1 Visit ?OT Evaluation ?$OT Eval Low Complexity: 1 Low ? ?Karleen Seebeck H., OTR/L ?Acute Rehabilitation ? ?Anabelle Bungert Elane Bing Plume ?02/16/2022, 10:05 AM ?

## 2022-02-16 NOTE — Discharge Summary (Signed)
PatientPhysician Discharge Summary  ?Alexandra Griffith [id number] DOB: 1941/01/13 DOA: 02/14/2022 ? ?PCP: Sherrine Maples, MD ? ?Admit date: 02/14/2022 ?Discharge date: 02/16/2022 ?30 Day Unplanned Readmission Risk Score   ? ?Flowsheet Row ED to Hosp-Admission (Current) from 02/14/2022 in [facility]  ?30 Day Unplanned Readmission Risk Score (%) 11.31 Filed at 02/16/2022 0800  ? ?  ? ? This score is the patient's risk of an unplanned readmission within 30 days of being discharged (0 -100%). The score is based on dignosis, age, lab data, medications, orders, and past utilization.   ?Low:  0-14.9   Medium: 15-21.9   High: 22-29.9   Extreme: 30 and above ? ?  ? ?  ? ? ? ?Admitted From: Home ?Disposition: Home ? ?Recommendations for Outpatient Follow-up:  ?Follow up with PCP in 1-2 weeks ?Please obtain BMP/CBC in one week ?Please follow up with your PCP on the following pending results: ?Unresulted Labs (From admission, onward)  ? ?  Start     Ordered  ? 02/16/22 [id number]  Basic metabolic panel  Once,   R       ? 02/16/22 0823  ? 02/16/22 0824  Magnesium  Once,   R       ? 02/16/22 [id number]  ? ?  ?  ? ?  ?  ? ? ?Home Health: None ?Equipment/Devices: None ? ?Discharge Condition: Stable ?CODE STATUS: Full code ?Diet recommendation: Cardiac ? ?Subjective: Seen and examined.  She has no complaints.  No more constipation. ? ?Brief/Interim Summary: 81 year old Afro-American female w/ HTN HLD,mild cognitive impairment, presented to the ER  02/14/22 with 2-week history of constipation also had poor appetite anorexia due to constipation, has had mild cough x3 weeks. ?In the ER temperature 97.5, heart rate 64, blood pressure 103/62 satting 99% on room air.Labs showed positive COVID, negative influenza UA was negative for infection. CMP showed sodium 138, potassium 2.8, BUN 29, creatinine 2.1-baseline 0.78/722-prior to that was in 1.3 range. WBC of 4.8, hemoglobin 12.6, platelets 213CT abdomen pelvis demonstrated no  acute intra-abdominal pathology. ?Patient was admitted for mild AKI, constipation and hypokalemia. ? ?Acute on chronic kidney disease stage IIIb: Likely due to poor p.o. intake.  Please note that patient does not have AKI and state patient has history of CKD stage IIIb and she did have acute on chronic kidney disease which is now back to baseline after she received some IV fluids. ?  ?Constipation: Per patient she had constipation.  However CT abdomen did not show any stool burden.  When talking to her today, she tells me that she has runny stools and says " what ever I am drinking it comes right through me" but still says that she has constipation.  When asked if she is eating any solid foods, she said no she is afraid of eating that.  So based on that history, it appears that patient does not have any constipation and neither did the CT abdomen showed.  She was advised to eat regular food so she can have formed stools.  She had no complaints. ?  ?COVID-19 virus infection: Per H&P, she had history of cough for 3 weeks.  She was tested positive for COVID-19 but was not hypoxic and did not have any chest x-ray findings so she was not treated with any antiviral either.  During my encounter today, she denied having any respiratory symptoms and she denied any cough either.  No indication of treatment.  However she was advised to  quarantine per [employer] recommendations. ?  ?Hypokalemia: Resolved ? ?Sinus bradycardia:stable, cont to avoid  AV nodal blockers. ? ?Discharge plan was discussed with patient and/or family member and they verbalized understanding and agreed with it.  ?Discharge Diagnoses:  ?Principal Problem: ?  Acute renal failure superimposed on stage 3b chronic kidney disease ([place]) ?Active Problems: ?  Hypokalemia ?  COVID-19 virus infection ?  Constipation ?  Essential hypertension ?  Mixed hyperlipidemia ?  Prolonged QT interval ?  Sinus bradycardia ? ? ? ?Discharge Instructions ? ? ?Allergies as of 02/16/2022    ?No Known Allergies ?  ? ?  ?Medication List  ?  ? ?STOP taking these medications   ? ?mirtazapine 15 MG tablet ?Commonly known as: Remeron ?  ?potassium chloride SA 20 MEQ tablet ?Commonly known as: KLOR-CON M ?  ? ?  ? ?TAKE these medications   ? ?amLODipine 5 MG tablet ?Commonly known as: NORVASC ?Take 1 tablet (5 mg total) by mouth daily. ?  ?aspirin 81 MG EC tablet ?Take by mouth. ?  ?escitalopram 20 MG tablet ?Commonly known as: LEXAPRO ?Take 20 mg by mouth daily. ?  ?feeding supplement (BOOST BREEZE) Liqd ?Take 1 each by mouth 3 (three) times daily. ?What changed: when to take this ?  ?NYQUIL PO ?Take 10 mLs by mouth at bedtime as needed (cough/cold symptoms and sleep). ?  ?rosuvastatin 40 MG tablet ?Commonly known as: CRESTOR ?Take 20 mg by mouth daily. ?  ?valsartan 80 MG tablet ?Commonly known as: DIOVAN ?Take 80 mg by mouth daily. ?  ? ?  ? ? Follow-up Information   ? ? Sherrine Maples, MD Follow up in 1 week(s).   ?Specialty: Internal Medicine ?Contact information: ?[address] ?[address] ?[place] [address] ?[phone] ? ? ?  ?  ? ?  ?  ? ?  ? ?No Known Allergies ? ?Consultations: None ? ? ?Procedures/Studies: ?CT ABDOMEN PELVIS WO CONTRAST ? ?Result Date: 02/14/2022 ?CLINICAL DATA:  Nausea and vomiting.  Poor appetite.  Fatigue. EXAM: CT ABDOMEN AND PELVIS WITHOUT CONTRAST TECHNIQUE: Multidetector CT imaging of the abdomen and pelvis was performed following the standard protocol without IV contrast. RADIATION DOSE REDUCTION: This exam was performed according to the departmental dose-optimization program which includes automated exposure control, adjustment of the mA and/or kV according to patient size and/or use of iterative reconstruction technique. COMPARISON:  04/02/2021 FINDINGS: Lower chest: No acute finding. Hepatobiliary: Liver parenchyma is normal without contrast. No calcified gallstones. Pancreas: Normal Spleen: Normal Adrenals/Urinary Tract: Adrenal glands are normal.  Kidneys are normal. No cyst, mass, stone or hydronephrosis. Bladder is normal. Stomach/Bowel: Stomach and small intestine are normal. Diverticulosis of the colon without evidence of diverticulitis. Vascular/Lymphatic: Aortic atherosclerosis. No aneurysm. IVC is normal. No adenopathy. Reproductive: Previous hysterectomy.  No pelvic mass. Other: No free fluid or air. Musculoskeletal: Minimal lumbar degenerative changes. IMPRESSION: No acute finding. No abnormality seen to explain the clinical presentation. Aortic Atherosclerosis (ICD10-I70.0). Diverticulosis of the colon without evidence of diverticulitis. Electronically Signed   By: Nelson Chimes M.D.   On: 02/14/2022 16:08   ? ? ?Discharge Exam: ?Vitals:  ? 02/15/22 2049 02/16/22 0519  ?BP: 117/68 128/82  ?Pulse: (!) 55 (!) 51  ?Resp: 18 18  ?Temp: 98.3 ?F (36.8 ?C) 98.6 ?F (37 ?C)  ?SpO2: 97% 99%  ? ?Vitals:  ? 02/15/22 0917 02/15/22 1737 02/15/22 2049 02/16/22 0519  ?BP: 132/68 121/70 117/68 128/82  ?Pulse: (!) 57 (!) 52 (!) 55 Marland Kitchen)  51  ?Resp: 18 18 18 18   ?Temp:  98.2 ?F (36.8 ?C) 98.3 ?F (36.8 ?C) 98.6 ?F (37 ?C)  ?TempSrc:  Oral Oral Oral  ?SpO2: 100% 98% 97% 99%  ?Weight:    69.6 kg  ?Height:      ? ? ?General: Pt is alert, awake, not in acute distress ?Cardiovascular: RRR, S1/S2 +, no rubs, no gallops ?Respiratory: CTA bilaterally, no wheezing, no rhonchi ?Abdominal: Soft, NT, ND, bowel sounds + ?Extremities: no edema, no cyanosis ? ? ? ?The results of significant diagnostics from this hospitalization (including imaging, microbiology, ancillary and laboratory) are listed below for reference.   ? ? ?Microbiology: ?Recent Results (from the past 240 hour(s))  ?Resp Panel by RT-PCR (Flu A&B, Covid) Nasopharyngeal Swab     Status: Abnormal  ? Collection Time: 02/14/22  6:42 PM  ? Specimen: Nasopharyngeal Swab; Nasopharyngeal(NP) swabs in vial transport medium  ?Result Value Ref Range Status  ? SARS Coronavirus 2 by RT PCR POSITIVE (A) NEGATIVE Final  ?  Comment:  (NOTE) ?SARS-CoV-2 target nucleic acids are DETECTED. ? ?The SARS-CoV-2 RNA is generally detectable in upper respiratory ?specimens during the acute phase of infection. Positive results are ?indicative of the pr

## 2022-02-16 NOTE — Progress Notes (Signed)
PT Cancellation Note ? ?Patient Details ?Name: Alexandra Griffith ?MRN: [id number] ?DOB: January 28, 1941 ? ? ?Cancelled Treatment:    Reason Eval/Treat Not Completed: PT screened, no needs identified, will sign off Per OT, patient functioning independently and at baseline functioning. No PT needs identified. PT will sign off.  ? ?Epimenio Schetter A. Dan Humphreys, PT, DPT ?Acute Rehabilitation Services ?Pager [phone] ?Office [phone] ? ? ? ?Stephanee Barcomb A Homer Pfeifer ?02/16/2022, 9:40 AM ?

## 2022-02-16 NOTE — Plan of Care (Signed)

## 2023-09-12 IMAGING — CT CT ABD-PELV W/O CM
2 of 4 series · 16 of 46 positions shown, 18 images · non-contrast
Comparison: 04/02/2021

CLINICAL DATA: Nausea and vomiting.  Poor appetite.  Fatigue.



[Series 3: axial st · axial · 0.96mm/px · z∈[+604,+1014]mm · 13 of 90 slices shown, 15 images]
[im 4/90  soft-tissue]
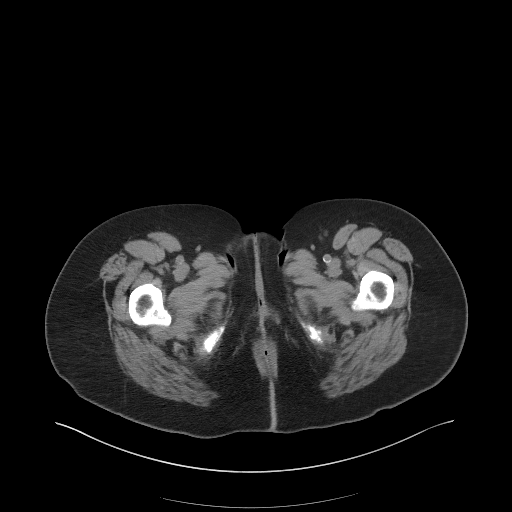
[im 4/90  bone]
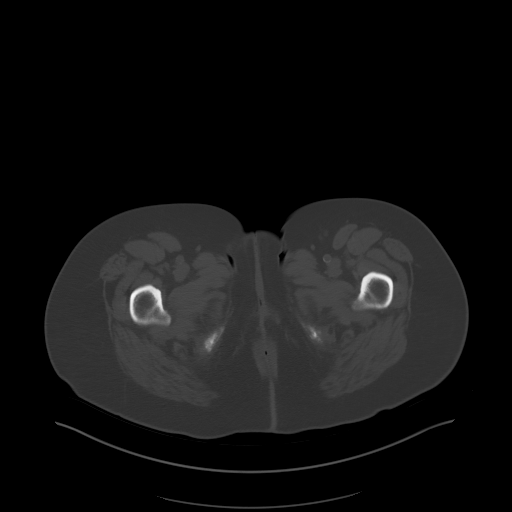
[im 12/90  soft-tissue]
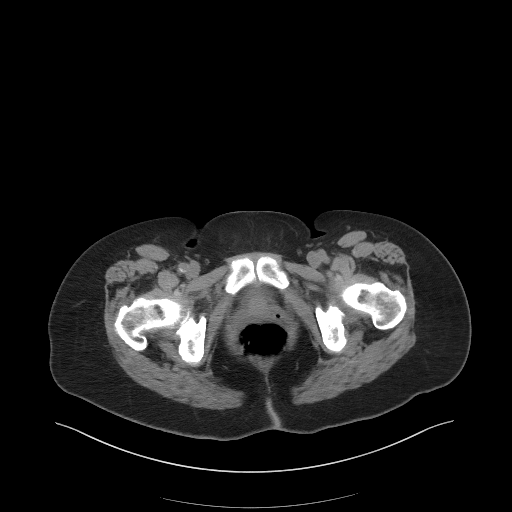
[im 20/90  soft-tissue]
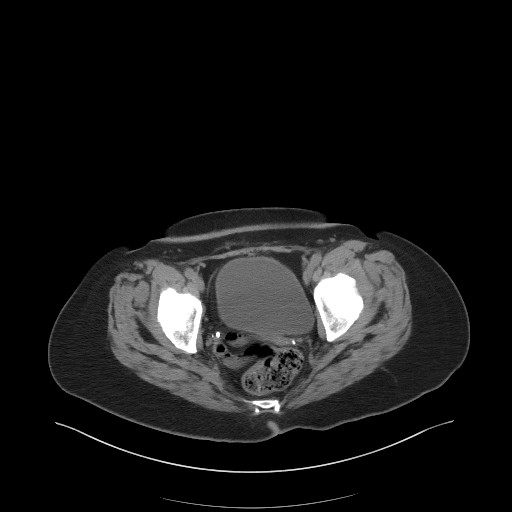
[im 24/90  soft-tissue]
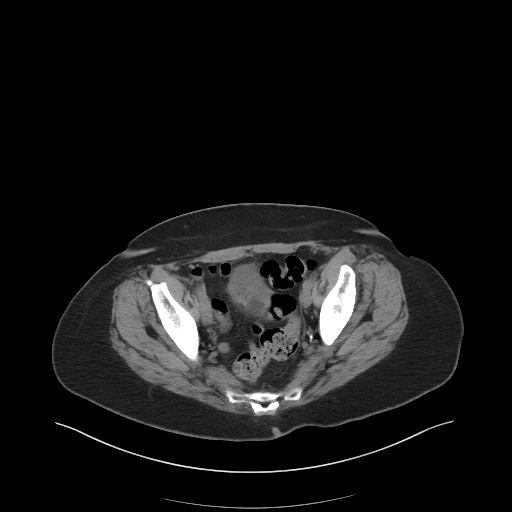
[im 31/90  soft-tissue]
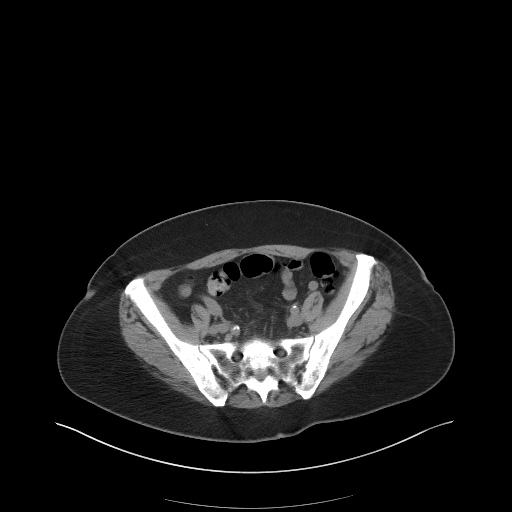
[im 39/90  soft-tissue]
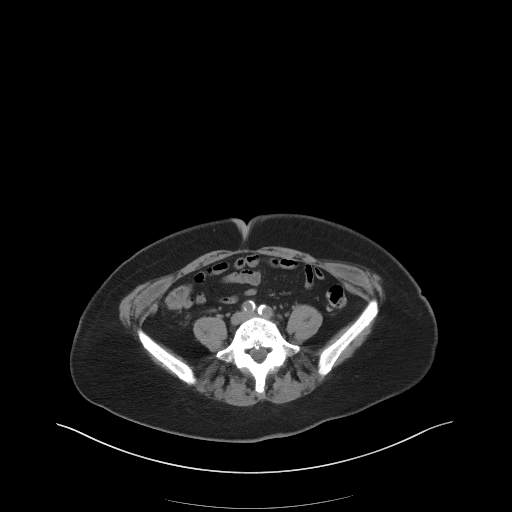
[im 47/90  soft-tissue]
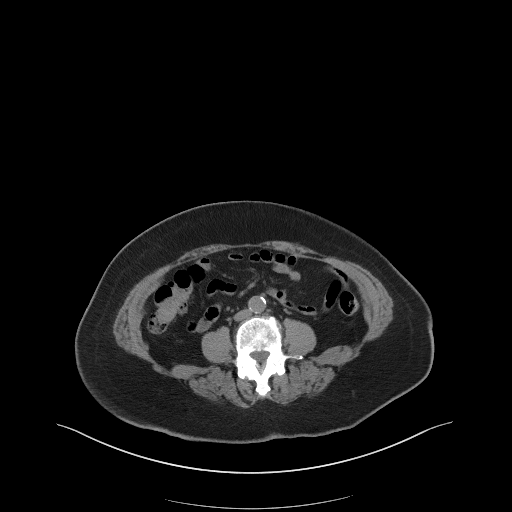
[im 51/90  soft-tissue]
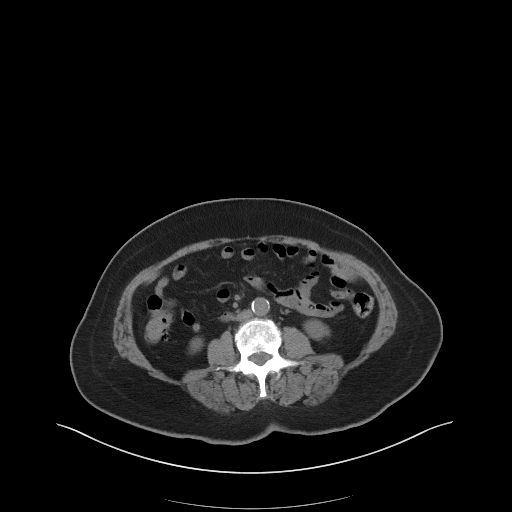
[im 59/90  soft-tissue]
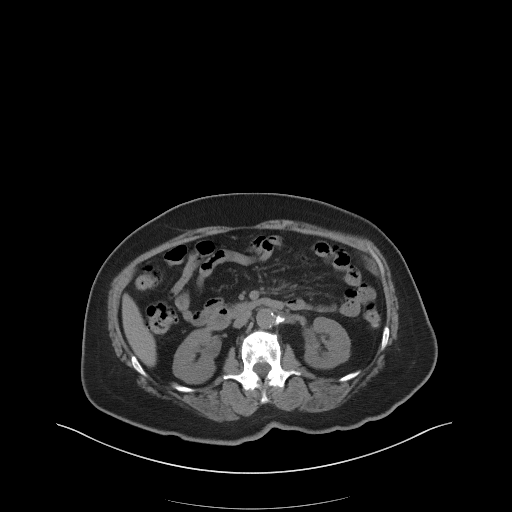
[im 59/90  bone]
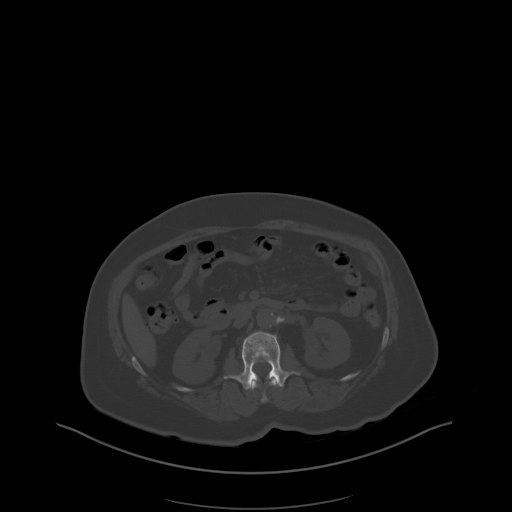
[im 66/90  soft-tissue]
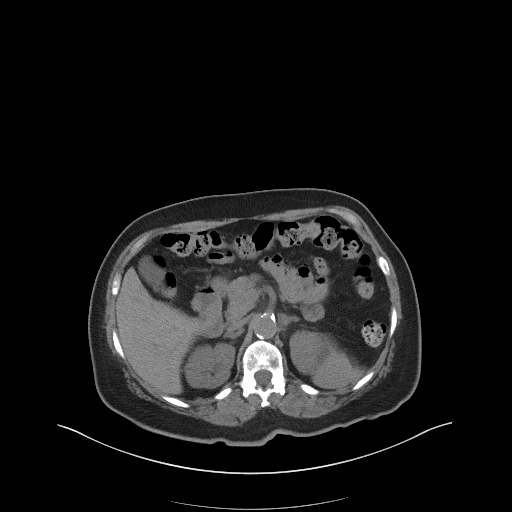
[im 70/90  soft-tissue]
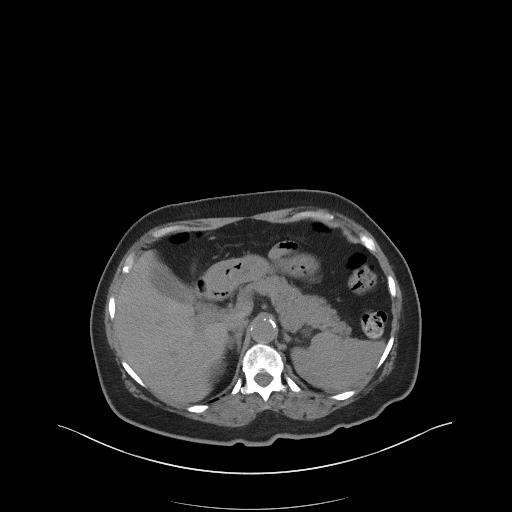
[im 78/90  soft-tissue]
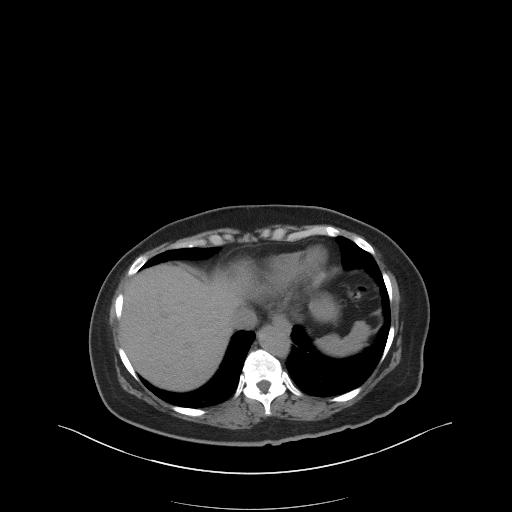
[im 86/90  soft-tissue]
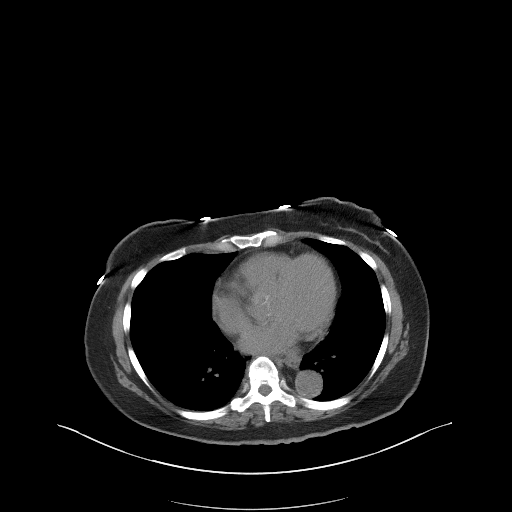

[Series 6: coronal st · coronal · 0.69mm/px · 3 of 97 slices shown]
[im 33/97  soft-tissue]
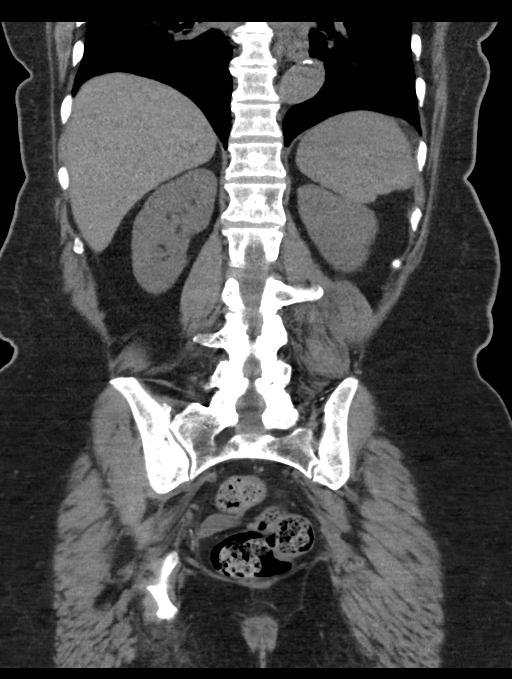
[im 43/97  soft-tissue]
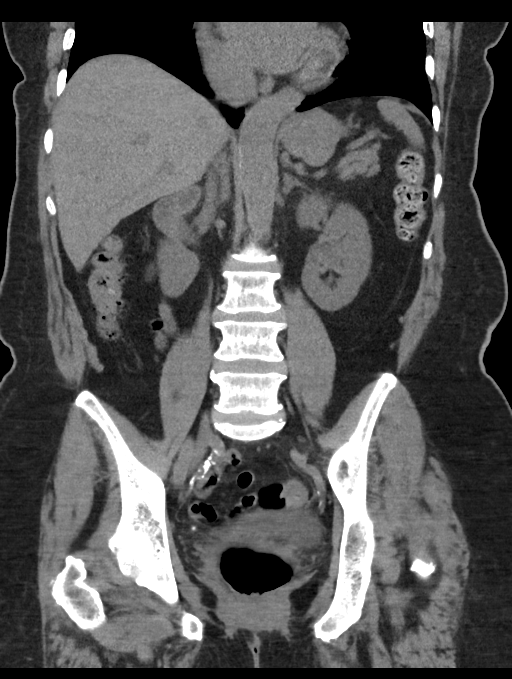
[im 54/97  soft-tissue]
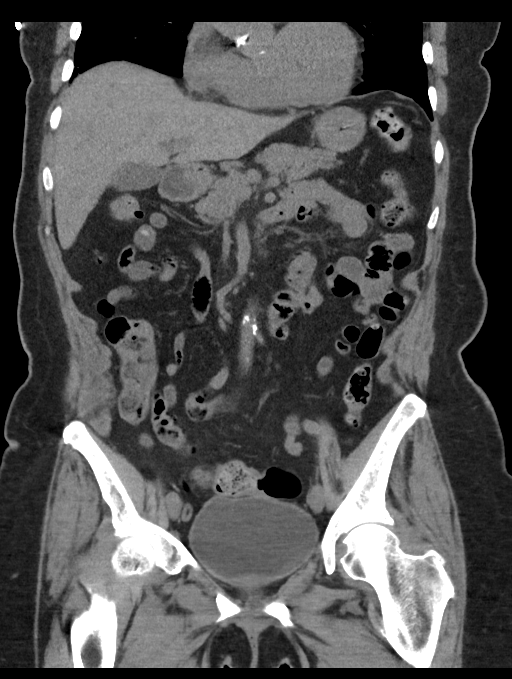

[16 of 46 positions shown; findings below may reference images not displayed]

FINDINGS: Lower chest: No acute finding.

Hepatobiliary: Liver parenchyma is normal without contrast. No
calcified gallstones.

Pancreas: Normal

Spleen: Normal

Adrenals/Urinary Tract: Adrenal glands are normal. Kidneys are
normal. No cyst, mass, stone or hydronephrosis. Bladder is normal.

Stomach/Bowel: Stomach and small intestine are normal.
Diverticulosis of the colon without evidence of diverticulitis.

Vascular/Lymphatic: Aortic atherosclerosis. No aneurysm. IVC is
normal. No adenopathy.

Reproductive: Previous hysterectomy.  No pelvic mass.

Other: No free fluid or air.

Musculoskeletal: Minimal lumbar degenerative changes.
IMPRESSION: No acute finding. No abnormality seen to explain the clinical
presentation.

Aortic Atherosclerosis ([id number]).

Diverticulosis of the colon without evidence of diverticulitis.
